# Patient Record
Sex: Male | Born: 1990 | Race: Black or African American | Hispanic: No | Marital: Single | Smoking: Never smoker
Health system: Southern US, Community
[De-identification: ages and names within clinical notes are randomized; demographics above are authoritative.]

---
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
## 2009-01-26 ENCOUNTER — Emergency Department (HOSPITAL_COMMUNITY): Admission: EM | Admit: 2009-01-26 | Discharge: 2009-01-26 | Payer: Self-pay | Admitting: Family Medicine

## 2010-11-28 ENCOUNTER — Emergency Department (HOSPITAL_COMMUNITY): Payer: Self-pay

## 2010-11-28 ENCOUNTER — Emergency Department (HOSPITAL_COMMUNITY)
Admission: EM | Admit: 2010-11-28 | Discharge: 2010-11-29 | Disposition: A | Discharge status: Home / Self Care | Payer: No Typology Code available for payment source | Attending: Emergency Medicine | Admitting: Emergency Medicine

## 2010-11-28 DIAGNOSIS — M25569 Pain in unspecified knee: Secondary | ICD-10-CM | POA: Insufficient documentation

## 2010-11-28 DIAGNOSIS — S01409A Unspecified open wound of unspecified cheek and temporomandibular area, initial encounter: Secondary | ICD-10-CM | POA: Insufficient documentation

## 2010-11-28 DIAGNOSIS — S99919A Unspecified injury of unspecified ankle, initial encounter: Secondary | ICD-10-CM | POA: Insufficient documentation

## 2010-11-28 DIAGNOSIS — R269 Unspecified abnormalities of gait and mobility: Secondary | ICD-10-CM | POA: Insufficient documentation

## 2010-11-28 DIAGNOSIS — R079 Chest pain, unspecified: Secondary | ICD-10-CM | POA: Insufficient documentation

## 2010-11-28 DIAGNOSIS — S8990XA Unspecified injury of unspecified lower leg, initial encounter: Secondary | ICD-10-CM | POA: Insufficient documentation

## 2010-11-28 DIAGNOSIS — M545 Low back pain, unspecified: Secondary | ICD-10-CM | POA: Insufficient documentation

## 2010-11-28 DIAGNOSIS — M546 Pain in thoracic spine: Secondary | ICD-10-CM | POA: Insufficient documentation

## 2010-11-28 DIAGNOSIS — R071 Chest pain on breathing: Secondary | ICD-10-CM | POA: Insufficient documentation

## 2010-11-28 DIAGNOSIS — R209 Unspecified disturbances of skin sensation: Secondary | ICD-10-CM | POA: Insufficient documentation

## 2010-11-28 DIAGNOSIS — S139XXA Sprain of joints and ligaments of unspecified parts of neck, initial encounter: Secondary | ICD-10-CM | POA: Insufficient documentation

## 2010-11-28 DIAGNOSIS — M542 Cervicalgia: Secondary | ICD-10-CM | POA: Insufficient documentation

## 2010-11-28 DIAGNOSIS — M79609 Pain in unspecified limb: Secondary | ICD-10-CM | POA: Insufficient documentation

## 2010-11-28 DIAGNOSIS — IMO0002 Reserved for concepts with insufficient information to code with codable children: Secondary | ICD-10-CM | POA: Insufficient documentation

## 2010-11-28 DIAGNOSIS — S6000XA Contusion of unspecified finger without damage to nail, initial encounter: Secondary | ICD-10-CM | POA: Insufficient documentation

## 2017-08-13 ENCOUNTER — Other Ambulatory Visit: Payer: Self-pay

## 2017-08-13 ENCOUNTER — Emergency Department (HOSPITAL_COMMUNITY)
Admission: EM | Admit: 2017-08-13 | Discharge: 2017-08-13 | Disposition: A | Discharge status: Home / Self Care | Payer: Self-pay | Attending: Emergency Medicine | Admitting: Emergency Medicine

## 2017-08-13 ENCOUNTER — Encounter (HOSPITAL_COMMUNITY): Payer: Self-pay | Admitting: Emergency Medicine

## 2017-08-13 ENCOUNTER — Emergency Department (HOSPITAL_COMMUNITY): Payer: Self-pay

## 2017-08-13 DIAGNOSIS — Y33XXXA Other specified events, undetermined intent, initial encounter: Secondary | ICD-10-CM | POA: Insufficient documentation

## 2017-08-13 DIAGNOSIS — S93402A Sprain of unspecified ligament of left ankle, initial encounter: Secondary | ICD-10-CM | POA: Insufficient documentation

## 2017-08-13 DIAGNOSIS — Y92310 Basketball court as the place of occurrence of the external cause: Secondary | ICD-10-CM | POA: Insufficient documentation

## 2017-08-13 DIAGNOSIS — Y998 Other external cause status: Secondary | ICD-10-CM | POA: Insufficient documentation

## 2017-08-13 DIAGNOSIS — Y9367 Activity, basketball: Secondary | ICD-10-CM | POA: Insufficient documentation

## 2017-08-13 MED ORDER — IBUPROFEN 600 MG PO TABS: 600.0000 mg | ORAL_TABLET | Freq: Four times a day (QID) | ORAL | 0 refills | Status: DC | PRN

## 2017-08-13 MED ORDER — CYCLOBENZAPRINE HCL 10 MG PO TABS: 10.0000 mg | ORAL_TABLET | Freq: Two times a day (BID) | ORAL | 0 refills | Status: DC | PRN

## 2017-08-13 NOTE — ED Triage Notes (Signed)
Pt rolled his ankle while playing basketball, denies other injuries.

## 2017-08-13 NOTE — ED Provider Notes (Signed)
[facility]   CSN: [id number] Arrival date & time: 08/13/17  1720     History   Chief Complaint Chief Complaint  Patient presents with  . Ankle Injury    HPI Adrian Mcpherson is a 26 y.o. male.  HPI   26 year old male presenting for evaluation of ankle injury.  Patient report this morning he was playing basketball.  When he jumped he landed awkwardly on his left ankle.  He report acute onset of sharp radiating pain from the ankle to foot, worsening with weightbearing or with movement.  States pain is 10 out of 10.  No associated knee or hip pain.  Aside from rest no other specific treatment tried.  Denies any numbness.  States he is unable to bear weight secondary to the pain.  History reviewed. No pertinent past medical history.  There are no active problems to display for this patient.        Home Medications    Prior to Admission medications   Not on File    Family History No family history on file.  Social History Social History   Tobacco Use  . Smoking status: Not on file  Substance Use Topics  . Alcohol use: Not on file  . Drug use: Not on file     Allergies   Patient has no known allergies.   Review of Systems Review of Systems  Constitutional: Negative for fever.  Musculoskeletal: Positive for arthralgias and joint swelling.  Skin: Negative for rash and wound.  Neurological: Negative for numbness.     Physical Exam Updated Vital Signs BP 138/80   Pulse 72   Temp 98.8 F (37.1 C) (Oral)   Resp 18   Ht 6\' 1"  (1.854 m)   Wt 95.3 kg (210 lb)   SpO2 98%   BMI 27.71 kg/m   Physical Exam  Constitutional: He appears well-developed and well-nourished. No distress.  HENT:  Head: Atraumatic.  Eyes: Conjunctivae are normal.  Neck: Neck supple.  Musculoskeletal: He exhibits tenderness (Left ankle: Tenderness to lateral malleoli region and dorsal region with associated swelling but  no crepitus or deformity.  Decreased ankle range of motion secondary to pain.  No significant pain to left foot on palpation.  Dorsalis pedis pulse palpable).  Neurological: He is alert.  Skin: No rash noted.  Psychiatric: He has a normal mood and affect.  Nursing note and vitals reviewed.    ED Treatments / Results  Labs (all labs ordered are listed, but only abnormal results are displayed) Labs Reviewed - No data to display  EKG  EKG Interpretation None       Radiology Dg Ankle Complete Left  Result Date: 08/13/2017 CLINICAL DATA:  Pain after trauma. EXAM: LEFT ANKLE COMPLETE - 3+ VIEW COMPARISON:  None. FINDINGS: There is no evidence of fracture, dislocation, or joint effusion. There is no evidence of arthropathy or other focal bone abnormality. Soft tissues are unremarkable. IMPRESSION: Negative. Electronically Signed   By: Gerome Samavid  Williams III M.D   On: 08/13/2017 18:02   Dg Foot Complete Left  Result Date: 08/13/2017 CLINICAL DATA:  Pain after trauma EXAM: LEFT FOOT - COMPLETE 3+ VIEW COMPARISON:  None. FINDINGS: There is no evidence of fracture or dislocation. There is no evidence of arthropathy or other focal bone abnormality. Soft tissues are unremarkable. IMPRESSION: Negative. Electronically Signed   By: Gerome Samavid  Williams III M.D   On: 08/13/2017 18:03    Procedures Procedures (including  critical care time)  Medications Ordered in ED Medications - No data to display   Initial Impression / Assessment and Plan / ED Course  I have reviewed the triage vital signs and the nursing notes.  Pertinent labs & imaging results that were available during my care of the patient were reviewed by me and considered in my medical decision making (see chart for details).     BP 138/80   Pulse 72   Temp 98.8 F (37.1 C) (Oral)   Resp 18   Ht 6\' 1"  (1.854 m)   Wt 95.3 kg (210 lb)   SpO2 98%   BMI 27.71 kg/m    Final Clinical Impressions(s) / ED Diagnoses   Final  diagnoses:  Sprain of left ankle, unspecified ligament, initial encounter    ED Discharge Orders        Ordered    ibuprofen (ADVIL,MOTRIN) 600 MG tablet  Every 6 hours PRN     08/13/17 1859    cyclobenzaprine (FLEXERIL) 10 MG tablet  2 times daily PRN     08/13/17 1859     7:00 PM Mechanical injury to left ankle.  Negative x-ray of left ankle and foot.  Rice therapy discussed.  Likely ankle sprain.  Crutches and ASO provided.   Fayrene Helperran, Danarius Mcconathy, PA-C 08/13/17 1901    Raeford RazorKohut, Stephen, MD 08/16/17 1146

## 2018-07-12 ENCOUNTER — Encounter (HOSPITAL_COMMUNITY): Payer: Self-pay | Admitting: Emergency Medicine

## 2018-07-12 ENCOUNTER — Ambulatory Visit (HOSPITAL_COMMUNITY)
Admission: EM | Admit: 2018-07-12 | Discharge: 2018-07-12 | Disposition: A | Discharge status: Home / Self Care | Payer: Self-pay | Attending: Family Medicine | Admitting: Family Medicine

## 2018-07-12 DIAGNOSIS — R300 Dysuria: Secondary | ICD-10-CM | POA: Insufficient documentation

## 2018-07-12 DIAGNOSIS — R369 Urethral discharge, unspecified: Secondary | ICD-10-CM | POA: Insufficient documentation

## 2018-07-12 MED ORDER — CEFTRIAXONE SODIUM 250 MG IJ SOLR
INTRAMUSCULAR | Status: AC
  Filled 2018-07-12: qty 250

## 2018-07-12 MED ORDER — CEFTRIAXONE SODIUM 250 MG IJ SOLR
250.0000 mg | Freq: Once | INTRAMUSCULAR | Status: AC
  Administered 2018-07-12: 250 mg via INTRAMUSCULAR

## 2018-07-12 MED ORDER — LIDOCAINE HCL (PF) 1 % IJ SOLN
INTRAMUSCULAR | Status: AC
  Filled 2018-07-12: qty 2

## 2018-07-12 MED ORDER — AZITHROMYCIN 250 MG PO TABS
ORAL_TABLET | ORAL | Status: AC
  Filled 2018-07-12: qty 4

## 2018-07-12 MED ORDER — AZITHROMYCIN 250 MG PO TABS
1000.0000 mg | ORAL_TABLET | Freq: Once | ORAL | Status: AC
  Administered 2018-07-12: 1000 mg via ORAL

## 2018-07-12 NOTE — Discharge Instructions (Signed)
Given rocephin 250mg  injection and azithromycin 1g in office Urine cytology obtained  Declines HIV/ syphilis testing today We will follow up with you regarding the results of your test If tests are positive, please abstain from sexual activity for at least 7 days and notify partners Follow up with PCP or [facility] if symptoms persists Return here or go to ER if you have any new or worsening symptoms

## 2018-07-12 NOTE — ED Triage Notes (Signed)
Pt c/o penile discharge x2 days, pt worried about bladder infection. Pt is sexually active.

## 2018-07-12 NOTE — ED Provider Notes (Signed)
[facility]   [id number] 07/12/18 Arrival Time: 1038   CC: Penile discharge and dysuria  SUBJECTIVE:  Adrian Mcpherson is a 27 y.o. male who presents with penile discharge and dysuria x 2 days.   Partner asymptomatic.  Last unprotected sexual encounter 2 weeks ago.  Sexually active with 2 male partners within the last 6 months.  Denies similar symptoms in the past.  Denies fever, chills, nausea, vomiting, abdominal or pelvic pain, penile rashes or lesions, testicular swelling or pain.     No LMP for male patient.  ROS: As per HPI.  History reviewed. No pertinent past medical history. History reviewed. No pertinent surgical history. No Known Allergies No current facility-administered medications on file prior to encounter.    Current Outpatient Medications on File Prior to Encounter  Medication Sig Dispense Refill  . cyclobenzaprine (FLEXERIL) 10 MG tablet Take 1 tablet (10 mg total) by mouth 2 (two) times daily as needed for muscle spasms. (Patient not taking: Reported on 07/12/2018) 20 tablet 0  . ibuprofen (ADVIL,MOTRIN) 600 MG tablet Take 1 tablet (600 mg total) by mouth every 6 (six) hours as needed. (Patient not taking: Reported on 07/12/2018) 30 tablet 0   Social History   Socioeconomic History  . Marital status: Single    Spouse name: Not on file  . Number of children: Not on file  . Years of education: Not on file  . Highest education level: Not on file  Occupational History  . Not on file  Social Needs  . Financial resource strain: Not on file  . Food insecurity:    Worry: Not on file    Inability: Not on file  . Transportation needs:    Medical: Not on file    Non-medical: Not on file  Tobacco Use  . Smoking status: Never Smoker  Substance and Sexual Activity  . Alcohol use: Never    Frequency: Never  . Drug use: Never  . Sexual activity: Not on file  Lifestyle  . Physical activity:    Days per week: Not on file    Minutes per session:  Not on file  . Stress: Not on file  Relationships  . Social connections:    Talks on phone: Not on file    Gets together: Not on file    Attends religious service: Not on file    Active member of club or organization: Not on file    Attends meetings of clubs or organizations: Not on file    Relationship status: Not on file  . Intimate partner violence:    Fear of current or ex partner: Not on file    Emotionally abused: Not on file    Physically abused: Not on file    Forced sexual activity: Not on file  Other Topics Concern  . Not on file  Social History Narrative  . Not on file   Family History  Problem Relation Age of Onset  . Healthy Mother   . Healthy Father     OBJECTIVE:  Vitals:   07/12/18 1053  BP: (!) 153/69  Pulse: (!) 52  Resp: 14  Temp: 98 F (36.7 C)  SpO2: 100%     General appearance: alert, NAD, appears stated age Head: NCAT Throat: lips, mucosa, and tongue normal; teeth and gums normal Lungs: CTA bilaterally without adventitious breath sounds Heart: regular rate and rhythm.  Radial pulses 2+ symmetrical bilaterally Back: no CVA tenderness Abdomen: soft, non-tender; bowel sounds normal; no guarding  GU: declines  Skin: warm and dry Psychological:  Alert and cooperative. Normal mood and affect.  ASSESSMENT & PLAN:  1. Penile discharge   2. Dysuria     Meds ordered this encounter  Medications  . azithromycin (ZITHROMAX) tablet 1,000 mg  . cefTRIAXone (ROCEPHIN) injection 250 mg    Pending: Labs Reviewed  URINE CYTOLOGY ANCILLARY ONLY    Given rocephin 250mg  injection and azithromycin 1g in office Urine cytology obtained  Declines HIV/ syphilis testing today We will follow up with you regarding the results of your test If tests are positive, please abstain from sexual activity for at least 7 days and notify partners Follow up with PCP or [facility] if symptoms persists Return here or go to ER if you have any new or worsening  symptoms    Reviewed expectations re: course of current medical issues. Questions answered. Outlined signs and symptoms indicating need for more acute intervention. Patient verbalized understanding. After Visit Summary given.       Rennis HardingWurst, Cordie Beazley, PA-C 07/12/18 1114

## 2018-07-13 LAB — URINE CYTOLOGY ANCILLARY ONLY
CHLAMYDIA, DNA PROBE: NEGATIVE
Neisseria Gonorrhea: POSITIVE — AB
Trichomonas: NEGATIVE

## 2018-07-14 ENCOUNTER — Telehealth (HOSPITAL_COMMUNITY): Payer: Self-pay | Admitting: Emergency Medicine

## 2018-07-14 NOTE — Telephone Encounter (Signed)
Test for gonorrhea was positive. This was treated at the urgent care visit with IM rocephin 250mg  and po zithromax 1g. Pt educated to refrain from sexual intercourse for 7 days after treatment to give the medicine time to work. Sexual partners need to be notified and tested/treated. Condoms may reduce risk of reinfection. Recheck or followup with PCP for further evaluation if symptoms are not improving. [facility] notified.   Spoke with patient about his test results. All questions answered.

## 2021-07-13 ENCOUNTER — Emergency Department (HOSPITAL_COMMUNITY)
Admission: EM | Admit: 2021-07-13 | Discharge: 2021-07-13 | Discharge status: Against Medical Advice | Payer: PRIVATE HEALTH INSURANCE | Attending: Emergency Medicine | Admitting: Emergency Medicine

## 2021-07-13 ENCOUNTER — Emergency Department (HOSPITAL_COMMUNITY): Payer: PRIVATE HEALTH INSURANCE

## 2021-07-13 DIAGNOSIS — R319 Hematuria, unspecified: Secondary | ICD-10-CM | POA: Insufficient documentation

## 2021-07-13 DIAGNOSIS — Z5321 Procedure and treatment not carried out due to patient leaving prior to being seen by health care provider: Secondary | ICD-10-CM | POA: Insufficient documentation

## 2021-07-13 DIAGNOSIS — R109 Unspecified abdominal pain: Secondary | ICD-10-CM | POA: Diagnosis not present

## 2021-07-13 LAB — URINALYSIS, MICROSCOPIC (REFLEX)
Bacteria, UA: NONE SEEN
RBC / HPF: >50 RBC/hpf (ref 0–5)

## 2021-07-13 LAB — URINALYSIS, ROUTINE W REFLEX MICROSCOPIC
Bilirubin Urine: NEGATIVE
Glucose, UA: NEGATIVE mg/dL
Ketones, ur: NEGATIVE mg/dL
Nitrite: NEGATIVE
Protein, ur: NEGATIVE mg/dL
Specific Gravity, Urine: 1.025 (ref 1.005–1.030)
pH: 6.5 (ref 5.0–8.0)

## 2021-07-13 LAB — COMPREHENSIVE METABOLIC PANEL
ALT: 18 U/L (ref 0–44)
AST: 22 U/L (ref 15–41)
Albumin: 4.2 g/dL (ref 3.5–5.0)
Alkaline Phosphatase: 65 U/L (ref 38–126)
Anion gap: 6 (ref 5–15)
BUN: 10 mg/dL (ref 6–20)
CO2: 26 mmol/L (ref 22–32)
Calcium: 9.3 mg/dL (ref 8.9–10.3)
Chloride: 105 mmol/L (ref 98–111)
Creatinine, Ser: 1.34 mg/dL — ABNORMAL HIGH (ref 0.61–1.24)
GFR, Estimated: >60 mL/min (ref >60)
Glucose, Bld: 92 mg/dL (ref 70–99)
Potassium: 4.3 mmol/L (ref 3.5–5.1)
Sodium: 137 mmol/L (ref 135–145)
Total Bilirubin: 0.7 mg/dL (ref 0.3–1.2)
Total Protein: 6.9 g/dL (ref 6.5–8.1)

## 2021-07-13 LAB — LIPASE, BLOOD: Lipase: 30 U/L (ref 11–51)

## 2021-07-13 LAB — CBC
HCT: 45.2 % (ref 39.0–52.0)
Hemoglobin: 14.2 g/dL (ref 13.0–17.0)
MCH: 27.7 pg (ref 26.0–34.0)
MCHC: 31.4 g/dL (ref 30.0–36.0)
MCV: 88.3 fL (ref 80.0–100.0)
Platelets: 183 10*3/uL (ref 150–400)
RBC: 5.12 MIL/uL (ref 4.22–5.81)
RDW: 12.7 % (ref 11.5–15.5)
WBC: 5.3 10*3/uL (ref 4.0–10.5)
nRBC: 0 % (ref 0.0–0.2)

## 2021-07-13 NOTE — ED Notes (Signed)
Patient decided to leave stated  that he had to pick his son up

## 2021-07-13 NOTE — ED Triage Notes (Signed)
Pt here d/t hematuria and abdominal pain. 6/10. Pt states he was elbowed in abdomin while playing basketball. Denies N/V/fever.

## 2021-07-13 NOTE — ED Provider Notes (Signed)
Emergency Medicine Provider Triage Evaluation Note  Adrian Mcpherson , a 30 y.o. male  was evaluated in triage.  Pt complains of abdominal pain x 1 week and hematuria starting today. Patient states he was elbowed in his right abdomen about a week and a half ago and had consistent pain. He states when using the restroom today he noticed blood dribbling from his urethra after urinating. No history of kidney stones.   Review of Systems  Positive: Hematuria, increased urinary frequency, abdominal pain Negative: Flank pain, scrotal pain, fever, diarrhea, constipation, blood in stool  Physical Exam  BP 127/85   Pulse (!) 50   Temp 98.4 F (36.9 C) (Oral)   Resp 16   SpO2 100%  Gen:   Awake, no distress   Resp:  Normal effort  MSK:   Moves extremities without difficulty  Other:  Abdomen soft, nontender, nondistended  Medical Decision Making  Medically screening exam initiated at 3:25 PM.  Appropriate orders placed.  Adrian Mcpherson was informed that the remainder of the evaluation will be completed by another provider, this initial triage assessment does not replace that evaluation, and the importance of remaining in the ED until their evaluation is complete.     Adrian Mcpherson 07/13/21 1527    Milagros Loll, MD 07/14/21 [phone]

## 2021-07-15 ENCOUNTER — Encounter (HOSPITAL_COMMUNITY): Payer: Self-pay | Admitting: Emergency Medicine

## 2021-07-15 ENCOUNTER — Ambulatory Visit (HOSPITAL_COMMUNITY)
Admission: EM | Admit: 2021-07-15 | Discharge: 2021-07-15 | Disposition: A | Discharge status: Home / Self Care | Payer: PRIVATE HEALTH INSURANCE | Attending: Internal Medicine | Admitting: Internal Medicine

## 2021-07-15 ENCOUNTER — Other Ambulatory Visit: Payer: Self-pay

## 2021-07-15 DIAGNOSIS — R3129 Other microscopic hematuria: Secondary | ICD-10-CM | POA: Insufficient documentation

## 2021-07-15 LAB — POCT URINALYSIS DIPSTICK, ED / UC
Bilirubin Urine: NEGATIVE
Glucose, UA: NEGATIVE mg/dL
Ketones, ur: NEGATIVE mg/dL
Nitrite: NEGATIVE
Protein, ur: 100 mg/dL — AB
Specific Gravity, Urine: >=1.03 (ref 1.005–1.030)
Urobilinogen, UA: 1 mg/dL (ref 0.0–1.0)
pH: 6 (ref 5.0–8.0)

## 2021-07-15 MED ORDER — NITROFURANTOIN MONOHYD MACRO 100 MG PO CAPS: 100.0000 mg | ORAL_CAPSULE | Freq: Two times a day (BID) | ORAL | 0 refills | Status: AC

## 2021-07-15 NOTE — ED Triage Notes (Signed)
Pt presents with frequent urination, bladder pressure, blood in urine, and bump on penis xs 1 week.

## 2021-07-15 NOTE — ED Provider Notes (Signed)
[place]    CSN: [id number] Arrival date & time: 07/15/21  [id number]      History   Chief Complaint Chief Complaint  Patient presents with   Urinary Frequency   Hematuria    HPI Adrian Mcpherson is a 30 y.o. male.   Hematuria, increased frequency Presented to ED on 11/14, but left without being seen in Triage had UA with microscopic hematuria and leukocytes, negative renal CT Reports that he has been having pink urine for about a week Denies any frank blood from the urethra States that he was hit about a week and a half ago in the flank while playing basketball, but does not think that this is affecting his pain He does not have any significant abdominal pain, only reports some discomfort over the bladder He denies any penile discharge, does not think that he has any sexually transmitted disease, but is not sure Reports increased frequency, urgency Denies flank pain, fever, vomiting Reports that he is able to urinate well and denies any difficulty eating States that this morning he noticed a swollen lymph node in his groin    History reviewed. No pertinent past medical history.  There are no problems to display for this patient.   History reviewed. No pertinent surgical history.     Home Medications    Prior to Admission medications   Medication Sig Start Date End Date Taking? Authorizing Provider  nitrofurantoin, macrocrystal-monohydrate, (MACROBID) 100 MG capsule Take 1 capsule (100 mg total) by mouth 2 (two) times daily for 7 days. 07/15/21 07/22/21 Yes Ladarious Kresse, Bernita Raisin, DO    Family History Family History  Problem Relation Age of Onset   Healthy Mother    Healthy Father     Social History Social History   Tobacco Use   Smoking status: Never   Smokeless tobacco: Never  Substance Use Topics   Alcohol use: Never   Drug use: Never     Allergies   Patient has no known allergies.   Review of Systems Review of Systems  All  other systems reviewed and are negative. Per HPI  Physical Exam Triage Vital Signs ED Triage Vitals  Enc Vitals Group     BP 07/15/21 0845 (!) 142/84     Pulse Rate 07/15/21 0845 (!) 56     Resp 07/15/21 0845 16     Temp 07/15/21 0845 98.2 F (36.8 C)     Temp Source 07/15/21 0845 Oral     SpO2 07/15/21 0845 97 %     Weight --      Height --      Head Circumference --      Peak Flow --      Pain Score 07/15/21 0844 3     Pain Loc --      Pain Edu? --      Excl. in [place]? --    No data found.  Updated Vital Signs BP (!) 142/84 (BP Location: Left Arm)   Pulse (!) 56   Temp 98.2 F (36.8 C) (Oral)   Resp 16   SpO2 97%   Visual Acuity Right Eye Distance:   Left Eye Distance:   Bilateral Distance:    Right Eye Near:   Left Eye Near:    Bilateral Near:     Physical Exam Constitutional:      General: He is not in acute distress.    Appearance: Normal appearance. He is not ill-appearing.  HENT:  Head: Normocephalic and atraumatic.  Cardiovascular:     Rate and Rhythm: Normal rate.  Pulmonary:     Effort: Pulmonary effort is normal.  Abdominal:     Palpations: Abdomen is soft.     Tenderness: There is no abdominal tenderness. There is no right CVA tenderness, left CVA tenderness, guarding or rebound.  Skin:    General: Skin is warm and dry.  Neurological:     General: No focal deficit present.     Mental Status: He is alert and oriented to person, place, and time.     UC Treatments / Results  Labs (all labs ordered are listed, but only abnormal results are displayed) Labs Reviewed  POCT URINALYSIS DIPSTICK, ED / UC - Abnormal; Notable for the following components:      Result Value   Hgb urine dipstick LARGE (*)    Protein, ur 100 (*)    Leukocytes,Ua MODERATE (*)    All other components within normal limits  URINE CULTURE  CYTOLOGY, (ORAL, ANAL, URETHRAL) ANCILLARY ONLY    EKG   Radiology CT Renal Stone Study  Result Date:  07/13/2021 CLINICAL DATA:  Hematuria and abdominal pain. EXAM: CT ABDOMEN AND PELVIS WITHOUT CONTRAST TECHNIQUE: Multidetector CT imaging of the abdomen and pelvis was performed following the standard protocol without IV contrast. COMPARISON:  None. FINDINGS: Lower chest: No acute abnormality. Hepatobiliary: No focal liver abnormality is seen. No gallstones, gallbladder wall thickening, or biliary dilatation. Pancreas: Unremarkable. No pancreatic ductal dilatation or surrounding inflammatory changes. Spleen: Normal in size without focal abnormality. Adrenals/Urinary Tract: Adrenal glands are unremarkable. Kidneys are normal, without renal calculi, focal lesion, or hydronephrosis. Bladder is unremarkable. Stomach/Bowel: Stomach is within normal limits. Appendix appears normal. No evidence of bowel wall thickening, distention, or inflammatory changes. Vascular/Lymphatic: No significant vascular findings are present. No enlarged abdominal or pelvic lymph nodes. Reproductive: Prostate is unremarkable. Other: There is a small fat containing umbilical hernia. There is no free fluid or free air. Musculoskeletal: No acute or significant osseous findings. IMPRESSION: No acute localizing process in the abdomen or pelvis. Electronically Signed   By: Darliss Cheney M.D.   On: 07/13/2021 17:27    Procedures Procedures (including critical care time)  Medications Ordered in UC Medications - No data to display  Initial Impression / Assessment and Plan / UC Course  I have reviewed the triage vital signs and the nursing notes.  Pertinent labs & imaging results that were available during my care of the patient were reviewed by me and considered in my medical decision making (see chart for details).     He has had a normal renal CT.  He has microscopic hematuria, no frank blood at the urethra.  We will send urine for culture and also obtain STD testing.  We will go ahead and treat him for urinary tract infection, however  recommended that he follow-up with urology even if his symptoms improve given his microscopic hematuria will require further evaluation.  Also provided PCP assistance.  Given ED precautions, see AVS. Final Clinical Impressions(s) / UC Diagnoses   Final diagnoses:  Other microscopic hematuria     Discharge Instructions      We have sent a prescription for an antibiotic.  I have sent your urine off for culture to ensure if you have an infection or not.  We will also collect STD testing to make sure that this is not contributing to your symptoms.  I still recommend that you follow-up with a  urologist.  The primary care provider that I have added to this list is taking new patients, please give their office a call to schedule an appointment.  If you have significant worsening of your symptoms, especially if you have fever, worsening pain, frank blood coming from your penis, you should be seen in the emergency room right away.     ED Prescriptions     Medication Sig Dispense Auth. Provider   nitrofurantoin, macrocrystal-monohydrate, (MACROBID) 100 MG capsule Take 1 capsule (100 mg total) by mouth 2 (two) times daily for 7 days. 14 capsule Halvor Behrend, Bernita Raisin, DO      PDMP not reviewed this encounter.   [place], Bernita Raisin, DO 07/15/21 [phone]

## 2021-07-15 NOTE — Discharge Instructions (Addendum)
We have sent a prescription for an antibiotic.  I have sent your urine off for culture to ensure if you have an infection or not.  We will also collect STD testing to make sure that this is not contributing to your symptoms.  I still recommend that you follow-up with a urologist.  The primary care provider that I have added to this list is taking new patients, please give their office a call to schedule an appointment.  If you have significant worsening of your symptoms, especially if you have fever, worsening pain, frank blood coming from your penis, you should be seen in the emergency room right away.

## 2021-07-16 ENCOUNTER — Telehealth (HOSPITAL_COMMUNITY): Payer: Self-pay | Admitting: Emergency Medicine

## 2021-07-16 LAB — CYTOLOGY, (ORAL, ANAL, URETHRAL) ANCILLARY ONLY
Chlamydia: NEGATIVE
Comment: NEGATIVE
Comment: NEGATIVE
Comment: NORMAL
Neisseria Gonorrhea: POSITIVE — AB
Trichomonas: NEGATIVE

## 2021-07-16 LAB — URINE CULTURE: Culture: <10000 — AB

## 2021-07-16 NOTE — Telephone Encounter (Signed)
Per protocol, patient will need treatment with IM Rocephin 500mg  for positive Gonorrhea.  Can d/c Macrobid, negative urine culture.   Attempted to reach patient x 1, it rings twice and then busy signal x 3 [facility] notified Results seen on mychart

## 2022-02-23 ENCOUNTER — Other Ambulatory Visit: Payer: Self-pay

## 2022-02-23 ENCOUNTER — Emergency Department (HOSPITAL_COMMUNITY)
Admission: EM | Admit: 2022-02-23 | Discharge: 2022-02-23 | Disposition: A | Discharge status: Home / Self Care | Payer: Self-pay | Attending: Emergency Medicine | Admitting: Emergency Medicine

## 2022-02-23 ENCOUNTER — Emergency Department (HOSPITAL_COMMUNITY): Payer: Self-pay

## 2022-02-23 ENCOUNTER — Encounter (HOSPITAL_COMMUNITY): Payer: Self-pay

## 2022-02-23 DIAGNOSIS — S8261XA Displaced fracture of lateral malleolus of right fibula, initial encounter for closed fracture: Secondary | ICD-10-CM

## 2022-02-23 DIAGNOSIS — Y9367 Activity, basketball: Secondary | ICD-10-CM | POA: Insufficient documentation

## 2022-02-23 DIAGNOSIS — X501XXA Overexertion from prolonged static or awkward postures, initial encounter: Secondary | ICD-10-CM | POA: Insufficient documentation

## 2022-02-23 DIAGNOSIS — S93401A Sprain of unspecified ligament of right ankle, initial encounter: Secondary | ICD-10-CM | POA: Insufficient documentation

## 2022-02-23 DIAGNOSIS — M7989 Other specified soft tissue disorders: Secondary | ICD-10-CM | POA: Insufficient documentation

## 2022-02-23 MED ORDER — OXYCODONE-ACETAMINOPHEN 5-325 MG PO TABS
1.0000 | ORAL_TABLET | Freq: Once | ORAL | Status: AC
  Administered 2022-02-23: 1 via ORAL
  Filled 2022-02-23: qty 1

## 2022-02-23 MED ORDER — IBUPROFEN 200 MG PO TABS
600.0000 mg | ORAL_TABLET | Freq: Once | ORAL | Status: AC
  Administered 2022-02-23: 600 mg via ORAL
  Filled 2022-02-23: qty 3

## 2022-02-23 NOTE — ED Provider Notes (Signed)
[facility]-EMERGENCY DEPT Provider Note   CSN: [id number] Arrival date & time: 02/23/22  1859     History  Chief Complaint  Patient presents with   Ankle Injury    Adrian Mcpherson is a 31 y.o. male.   Ankle Injury  Here with right ankle pain and some mild foot pain after rolling his ankle while playing basketball today.  He states this occurred approximately an hour before arrival emergency room  No head injury or loss of consciousness.  No other associate symptoms.  Has taken medications prior to arrival.  No other injuries.     Home Medications Prior to Admission medications   Not on File      Allergies    Patient has no known allergies.    Review of Systems   Review of Systems  Physical Exam Updated Vital Signs BP (!) 150/107 (BP Location: Right Arm)   Pulse 80   Temp 98.1 F (36.7 C) (Oral)   Resp 18   Ht 6\' 1"  (1.854 m)   Wt 99.8 kg   SpO2 99%   BMI 29.03 kg/m  Physical Exam Vitals and nursing note reviewed.  Constitutional:      General: He is not in acute distress.    Appearance: Normal appearance. He is not ill-appearing.  HENT:     Head: Normocephalic and atraumatic.  Eyes:     General: No scleral icterus.       Right eye: No discharge.        Left eye: No discharge.     Conjunctiva/sclera: Conjunctivae normal.  Pulmonary:     Effort: Pulmonary effort is normal.     Breath sounds: No stridor.  Musculoskeletal:     Comments: Mild tenderness to palpation at base of fifth metatarsal Tenderness palpation of right lateral malleolus no posterior or medial ankle tenderness.  There is some swelling.  Sensation intact in all toes cap refill less than 2 seconds able to wiggle toes DP pulse 2+  Neurological:     Mental Status: He is alert and oriented to person, place, and time. Mental status is at baseline.     ED Results / Procedures / Treatments   Labs (all labs ordered are listed, but only abnormal results are  displayed) Labs Reviewed - No data to display  EKG None  Radiology No results found.  Procedures Procedures    Medications Ordered in ED Medications  ibuprofen (ADVIL) tablet 600 mg (600 mg Oral Given 02/23/22 2004)  oxyCODONE-acetaminophen (PERCOCET/ROXICET) 5-325 MG per tablet 1 tablet (1 tablet Oral Given 02/23/22 2004)    ED Course/ Medical Decision Making/ A&P                           Medical Decision Making Amount and/or Complexity of Data Reviewed Radiology: ordered.  Risk OTC drugs. Prescription drug management.   Here with right ankle pain and some mild foot pain after rolling his ankle while playing basketball today.  He states this occurred approximately an hour before arrival emergency room  No head injury or loss of consciousness.  No other associate symptoms.  Has taken medications prior to arrival.  No other injuries.  Lateral malleolus tenderness of the right ankle and mild tenderness at the base of the right fifth metatarsal.  Suspect ankle sprain versus lateral malleolus fracture.  X-ray obtained I viewed the images of this x-ray personally and agree with radiology read.  There  is a small avulsion fracture as result of the significant ankle sprain he suffered.  We will place in a cam walker provide crutches and recommend follow-up with orthopedics.  Return precautions discussed.  Patient agreeable to plan.  All questions answered to the best of my ability  Final Clinical Impression(s) / ED Diagnoses Final diagnoses:  Sprain of right ankle, unspecified ligament, initial encounter    Rx / DC Orders ED Discharge Orders     None         Gailen Shelter, [place] 02/23/22 2040    Arby Barrette, MD 02/26/22 1229

## 2022-02-23 NOTE — ED Triage Notes (Signed)
Pt reports with a right ankle injury after playing basketball today and falling around 1830.

## 2022-06-24 ENCOUNTER — Ambulatory Visit
Admission: RE | Admit: 2022-06-24 | Discharge: 2022-06-24 | Disposition: A | Discharge status: Home / Self Care | Payer: PRIVATE HEALTH INSURANCE | Source: Ambulatory Visit | Attending: Emergency Medicine | Admitting: Emergency Medicine

## 2022-06-24 VITALS — BP 136/83 | HR 80 | Temp 97.8°F | Resp 16

## 2022-06-24 DIAGNOSIS — Z9189 Other specified personal risk factors, not elsewhere classified: Secondary | ICD-10-CM | POA: Diagnosis present

## 2022-06-24 DIAGNOSIS — Z113 Encounter for screening for infections with a predominantly sexual mode of transmission: Secondary | ICD-10-CM | POA: Insufficient documentation

## 2022-06-24 DIAGNOSIS — R300 Dysuria: Secondary | ICD-10-CM | POA: Insufficient documentation

## 2022-06-24 LAB — POCT URINALYSIS DIP (MANUAL ENTRY)
Blood, UA: NEGATIVE
Glucose, UA: NEGATIVE mg/dL
Ketones, POC UA: NEGATIVE mg/dL
Nitrite, UA: NEGATIVE
Protein Ur, POC: 30 mg/dL — AB
Spec Grav, UA: >=1.03 — AB (ref 1.010–1.025)
Urobilinogen, UA: 1 E.U./dL
pH, UA: 6 (ref 5.0–8.0)

## 2022-06-24 NOTE — ED Triage Notes (Signed)
Patient c/o urinary frequency and lower abd pain x 1 week.   Home interventions: none

## 2022-06-24 NOTE — Discharge Instructions (Signed)
The results of your STD testing today which screens for gonorrhea, chlamydia, and trichomonas will be made posted to your MyChart account once it is complete.  This typically takes 2 to 4 days.  Please abstain from sexual intercourse of any kind, vaginal, oral or anal, until you have received the results of your STD testing.     If any of your results are abnormal, you will receive a phone call regarding treatment.  Prescriptions, if any are needed, will be provided for you at your pharmacy.     Our point-of-care analysis of your urine sample today was normal and did not reveal any concern for urinary tract infection.   If you have not had complete resolution of your symptoms after completing any needed treatment provided based on your STD test results, please return for repeat evaluation.   Thank you for visiting urgent care today.  I appreciate the opportunity to participate in your care.

## 2022-06-24 NOTE — ED Provider Notes (Signed)
[facility]    CSN: [id number] Arrival date & time: 06/24/22  1517    HISTORY   Chief Complaint  Patient presents with   Urinary Frequency    Believe I have a UTI - Entered by patient   HPI Adrian Mcpherson is a pleasant, 31 y.o. male who presents to urgent care today. Patient c/o urinary frequency, burning with urination and suprapubic pain x 1 week, denies penile discharge, testicular/scrotal pain/swelling, fever, constipation.  Patient states he has engaged in sexual intercourse without using a condom.  Patient states he is unaware of his partner having any symptoms of STD.  Patient request STD testing today.    The history is provided by the patient.   History reviewed. No pertinent past medical history. There are no problems to display for this patient.  History reviewed. No pertinent surgical history.  Home Medications    Prior to Admission medications   Not on File    Family History Family History  Problem Relation Age of Onset   Healthy Mother    Healthy Father    Social History Social History   Tobacco Use   Smoking status: Never   Smokeless tobacco: Never  Vaping Use   Vaping Use: Never used  Substance Use Topics   Alcohol use: Never   Drug use: Never   Allergies   Patient has no known allergies.  Review of Systems Review of Systems Pertinent findings revealed after performing a 14 point review of systems has been noted in the history of present illness.  Physical Exam Triage Vital Signs ED Triage Vitals  Enc Vitals Group     BP 06/26/21 0827 (!) 147/82     Pulse Rate 06/26/21 0827 72     Resp 06/26/21 0827 18     Temp 06/26/21 0827 98.3 F (36.8 C)     Temp Source 06/26/21 0827 Oral     SpO2 06/26/21 0827 98 %     Weight --      Height --      Head Circumference --      Peak Flow --      Pain Score 06/26/21 0826 5     Pain Loc --      Pain Edu? --      Excl. in [place]? --   No data found.  Updated Vital Signs BP  136/83 (BP Location: Left Arm)   Pulse 80   Temp 97.8 F (36.6 C) (Oral)   Resp 16   SpO2 96%   Physical Exam Vitals and nursing note reviewed.  Constitutional:      General: He is not in acute distress.    Appearance: Normal appearance. He is not ill-appearing.  HENT:     Head: Normocephalic and atraumatic.  Eyes:     General: Lids are normal.        Right eye: No discharge.        Left eye: No discharge.     Extraocular Movements: Extraocular movements intact.     Conjunctiva/sclera: Conjunctivae normal.     Right eye: Right conjunctiva is not injected.     Left eye: Left conjunctiva is not injected.  Neck:     Trachea: Trachea and phonation normal.  Cardiovascular:     Rate and Rhythm: Normal rate and regular rhythm.     Pulses: Normal pulses.     Heart sounds: Normal heart sounds. No murmur heard.    No friction rub. No gallop.  Pulmonary:     Effort: Pulmonary effort is normal. No accessory muscle usage, prolonged expiration or respiratory distress.     Breath sounds: Normal breath sounds. No stridor, decreased air movement or transmitted upper airway sounds. No decreased breath sounds, wheezing, rhonchi or rales.  Chest:     Chest wall: No tenderness.  Genitourinary:    Comments: Pt politely declines GU exam, pt did provide a penile swab for testing.   Musculoskeletal:        General: Normal range of motion.     Cervical back: Normal range of motion and neck supple. Normal range of motion.  Lymphadenopathy:     Cervical: No cervical adenopathy.  Skin:    General: Skin is warm and dry.     Findings: No erythema or rash.  Neurological:     General: No focal deficit present.     Mental Status: He is alert and oriented to person, place, and time.  Psychiatric:        Mood and Affect: Mood normal.        Behavior: Behavior normal.     Visual Acuity Right Eye Distance:   Left Eye Distance:   Bilateral Distance:    Right Eye Near:   Left Eye Near:     Bilateral Near:     UC Couse / Diagnostics / Procedures:     Radiology No results found.  Procedures Procedures (including critical care time) EKG  Pending results:  Labs Reviewed  POCT URINALYSIS DIP (MANUAL ENTRY) - Abnormal; Notable for the following components:      Result Value   Clarity, UA cloudy (*)    Bilirubin, UA small (*)    Spec Grav, UA >=1.030 (*)    Protein Ur, POC =30 (*)    Leukocytes, UA Small (1+) (*)    All other components within normal limits  CYTOLOGY, (ORAL, ANAL, URETHRAL) ANCILLARY ONLY    Medications Ordered in UC: Medications - No data to display  UC Diagnoses / Final Clinical Impressions(s)   I have reviewed the triage vital signs and the nursing notes.  Pertinent labs & imaging results that were available during my care of the patient were reviewed by me and considered in my medical decision making (see chart for details).    Final diagnoses:  Dysuria  At risk for sexually transmitted disease due to unprotected sex  Screening examination for STD (sexually transmitted disease)   STD screening was performed, patient advised that the results be posted to their MyChart and if any of the results are positive, they will be notified by phone, further treatment will be provided as indicated based on results of STD screening. Patient was advised to abstain from sexual intercourse until that they receive the results of their STD testing.  Patient was also advised to use condoms to protect themselves from STD exposure. Urinalysis today was not concerning for bladder or kidney infection. Return precautions advised.  Drug allergies reviewed, all questions addressed.   ED Prescriptions   None    PDMP not reviewed this encounter.  Disposition Upon Discharge:  Condition: stable for discharge home  Patient presented with concern for an acute illness with associated systemic symptoms and significant discomfort requiring urgent management. In my  opinion, this is a condition that a prudent lay person (someone who possesses an average knowledge of health and medicine) may potentially expect to result in complications if not addressed urgently such as respiratory distress, impairment of bodily function or dysfunction  of bodily organs.   As such, the patient has been evaluated and assessed, work-up was performed and treatment was provided in alignment with urgent care protocols and evidence based medicine.  Patient/parent/caregiver has been advised that the patient may require follow up for further testing and/or treatment if the symptoms continue in spite of treatment, as clinically indicated and appropriate.  Routine symptom specific, illness specific and/or disease specific instructions were discussed with the patient and/or caregiver at length.  Prevention strategies for avoiding STD exposure were also discussed.  The patient will follow up with their current PCP if and as advised. If the patient does not currently have a PCP we will assist them in obtaining one.   The patient may need specialty follow up if the symptoms continue, in spite of conservative treatment and management, for further workup, evaluation, consultation and treatment as clinically indicated and appropriate.  Patient/parent/caregiver verbalized understanding and agreement of plan as discussed.  All questions were addressed during visit.  Please see discharge instructions below for further details of plan.  Discharge Instructions:   Discharge Instructions      The results of your STD testing today which screens for gonorrhea, chlamydia, and trichomonas will be made posted to your MyChart account once it is complete.  This typically takes 2 to 4 days.  Please abstain from sexual intercourse of any kind, vaginal, oral or anal, until you have received the results of your STD testing.     If any of your results are abnormal, you will receive a phone call regarding  treatment.  Prescriptions, if any are needed, will be provided for you at your pharmacy.     Our point-of-care analysis of your urine sample today was normal and did not reveal any concern for urinary tract infection.   If you have not had complete resolution of your symptoms after completing any needed treatment provided based on your STD test results, please return for repeat evaluation.   Thank you for visiting urgent care today.  I appreciate the opportunity to participate in your care.          This office note has been dictated using Museum/gallery curator.  Unfortunately, this method of dictation can sometimes lead to typographical or grammatical errors.  I apologize for your inconvenience in advance if this occurs.  Please do not hesitate to reach out to me if clarification is needed.       Lynden Oxford Scales, [place] 06/24/22 [phone]

## 2022-07-02 ENCOUNTER — Telehealth (HOSPITAL_COMMUNITY): Payer: Self-pay | Admitting: Emergency Medicine

## 2022-07-02 LAB — CYTOLOGY, (ORAL, ANAL, URETHRAL) ANCILLARY ONLY
Chlamydia: NEGATIVE
Comment: NEGATIVE
Comment: NEGATIVE
Comment: NORMAL
Neisseria Gonorrhea: POSITIVE — AB
Trichomonas: NEGATIVE

## 2022-07-02 NOTE — Telephone Encounter (Signed)
Per protocol, patient will need treatment with IM Rocephin 500mg for positive Gonorrhea. Results seen on mychart 

## 2022-10-15 ENCOUNTER — Other Ambulatory Visit: Payer: Self-pay

## 2022-10-15 ENCOUNTER — Encounter (HOSPITAL_BASED_OUTPATIENT_CLINIC_OR_DEPARTMENT_OTHER): Payer: Self-pay | Admitting: Emergency Medicine

## 2022-10-15 ENCOUNTER — Emergency Department (HOSPITAL_BASED_OUTPATIENT_CLINIC_OR_DEPARTMENT_OTHER)
Admission: EM | Admit: 2022-10-15 | Discharge: 2022-10-15 | Disposition: A | Discharge status: Home / Self Care | Payer: PRIVATE HEALTH INSURANCE | Attending: Emergency Medicine | Admitting: Emergency Medicine

## 2022-10-15 DIAGNOSIS — R599 Enlarged lymph nodes, unspecified: Secondary | ICD-10-CM | POA: Diagnosis not present

## 2022-10-15 DIAGNOSIS — R221 Localized swelling, mass and lump, neck: Secondary | ICD-10-CM | POA: Diagnosis present

## 2022-10-15 DIAGNOSIS — R590 Localized enlarged lymph nodes: Secondary | ICD-10-CM

## 2022-10-15 LAB — CBC WITH DIFFERENTIAL/PLATELET
Abs Immature Granulocytes: 0.01 10*3/uL (ref 0.00–0.07)
Basophils Absolute: 0 10*3/uL (ref 0.0–0.1)
Basophils Relative: 1 %
Eosinophils Absolute: 0.3 10*3/uL (ref 0.0–0.5)
Eosinophils Relative: 6 %
HCT: 42.5 % (ref 39.0–52.0)
Hemoglobin: 13.9 g/dL (ref 13.0–17.0)
Immature Granulocytes: 0 %
Lymphocytes Relative: 39 %
Lymphs Abs: 2 10*3/uL (ref 0.7–4.0)
MCH: 27.9 pg (ref 26.0–34.0)
MCHC: 32.7 g/dL (ref 30.0–36.0)
MCV: 85.3 fL (ref 80.0–100.0)
Monocytes Absolute: 0.4 10*3/uL (ref 0.1–1.0)
Monocytes Relative: 7 %
Neutro Abs: 2.4 10*3/uL (ref 1.7–7.7)
Neutrophils Relative %: 47 %
Platelets: 186 10*3/uL (ref 150–400)
RBC: 4.98 MIL/uL (ref 4.22–5.81)
RDW: 13.2 % (ref 11.5–15.5)
WBC: 5.1 10*3/uL (ref 4.0–10.5)
nRBC: 0 % (ref 0.0–0.2)

## 2022-10-15 LAB — COMPREHENSIVE METABOLIC PANEL
ALT: 17 U/L (ref 0–44)
AST: 17 U/L (ref 15–41)
Albumin: 4.7 g/dL (ref 3.5–5.0)
Alkaline Phosphatase: 64 U/L (ref 38–126)
Anion gap: 9 (ref 5–15)
BUN: 12 mg/dL (ref 6–20)
CO2: 25 mmol/L (ref 22–32)
Calcium: 9.6 mg/dL (ref 8.9–10.3)
Chloride: 105 mmol/L (ref 98–111)
Creatinine, Ser: 1.2 mg/dL (ref 0.61–1.24)
GFR, Estimated: >60 mL/min (ref >60)
Glucose, Bld: 94 mg/dL (ref 70–99)
Potassium: 4.1 mmol/L (ref 3.5–5.1)
Sodium: 139 mmol/L (ref 135–145)
Total Bilirubin: 0.4 mg/dL (ref 0.3–1.2)
Total Protein: 7.2 g/dL (ref 6.5–8.1)

## 2022-10-15 NOTE — ED Triage Notes (Addendum)
Pt has mass/lump on the right side of his neck x 3 days. Pt reports it is painful with palpation.

## 2022-10-15 NOTE — ED Provider Notes (Signed)
  [facility] Provider Note   CSN: [id number] Arrival date & time: 10/15/22  1600     History {Add pertinent medical, surgical, social history, OB history to HPI:1} Chief Complaint  Patient presents with   Mass    Adrian Mcpherson is a 32 y.o. male.  HPI     Home Medications Prior to Admission medications   Not on File      Allergies    Patient has no known allergies.    Review of Systems   Review of Systems  Physical Exam Updated Vital Signs BP (!) 146/91 (BP Location: Right Arm)   Pulse 76   Temp 98.3 F (36.8 C) (Oral)   Resp 15   SpO2 100%  Physical Exam  ED Results / Procedures / Treatments   Labs (all labs ordered are listed, but only abnormal results are displayed) Labs Reviewed  CBC WITH DIFFERENTIAL/PLATELET  COMPREHENSIVE METABOLIC PANEL    EKG None  Radiology No results found.  Procedures Procedures  {Document cardiac monitor, telemetry assessment procedure when appropriate:1}  Medications Ordered in ED Medications - No data to display  ED Course/ Medical Decision Making/ A&P   {   Click here for ABCD2, HEART and other calculatorsREFRESH Note before signing :1}                          Medical Decision Making Amount and/or Complexity of Data Reviewed Labs: ordered.   ***  {Document critical care time when appropriate:1} {Document review of labs and clinical decision tools ie heart score, Chads2Vasc2 etc:1}  {Document your independent review of radiology images, and any outside records:1} {Document your discussion with family members, caretakers, and with consultants:1} {Document social determinants of health affecting pt's care:1} {Document your decision making why or why not admission, treatments were needed:1} Final Clinical Impression(s) / ED Diagnoses Final diagnoses:  None    Rx / DC Orders ED Discharge Orders     None

## 2022-10-15 NOTE — Discharge Instructions (Signed)
You were seen in the ER for evaluation of your enlarged lymph node on your neck. Your lab work was unremarkable today, I would like for you to follow up with your PCP about this.  If you do not have a primary care doctor, please call the [place] [facility] that is been included in this report.  You will likely need to have this monitored.  You can apply warm compresses and take Tylenol or ibuprofen as needed.  If you have any concerns, new or worsening symptoms, please turn to the nearest emergency room for evaluation.  Contact a health care provider if you have: Lymph glands that: Are still swollen after 2 weeks. Have suddenly gotten bigger or the swelling spreads. Are red, painful, or hard. Fluid leaking from the skin near an enlarged lymph gland. Problems with breathing. A fever, chills, or night sweats. Fatigue. A sore throat. Pain in your abdomen. Weight loss. Get help right away if you have: Severe pain. Chest pain. Shortness of breath. These symptoms may represent a serious problem that is an emergency. Do not wait to see if the symptoms will go away. Get medical help right away. Call your local emergency services (911 in the [place] Do not drive yourself to the hospital.

## 2023-01-20 IMAGING — CT CT RENAL STONE PROTOCOL
2 of 4 series · 16 of 46 positions shown, 18 images · non-contrast
Comparison: None.

CLINICAL DATA: Hematuria and abdominal pain.

EXAM:
CT ABDOMEN AND PELVIS WITHOUT CONTRAST
TECHNIQUE: Multidetector CT imaging of the abdomen and pelvis was performed
following the standard protocol without IV contrast.

[Series 3: renal stone 5.0 · axial · 0.87mm/px · z∈[-1109,-649]mm · 13 of 100 slices shown, 15 images]
[im 4/100  soft-tissue]
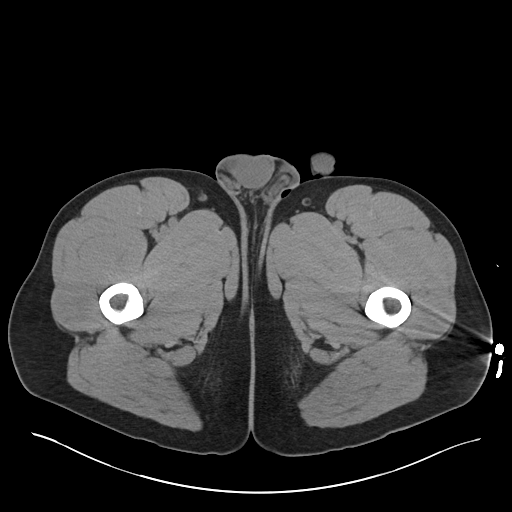
[im 4/100  bone]
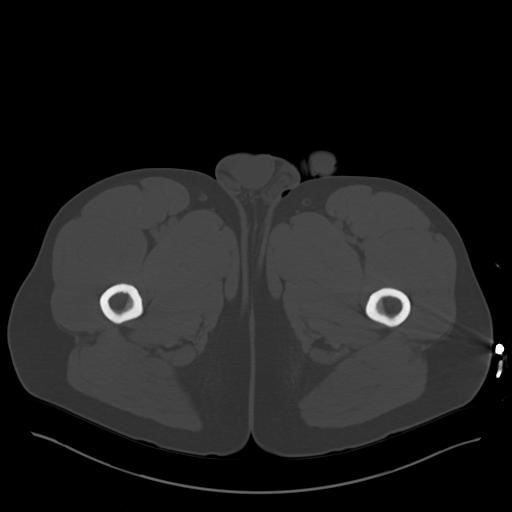
[im 12/100  soft-tissue]
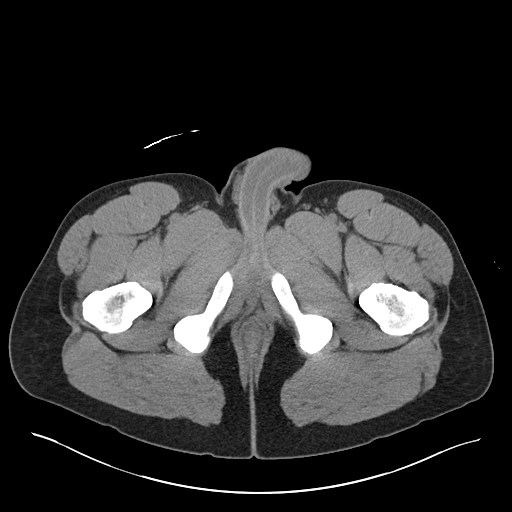
[im 20/100  soft-tissue]
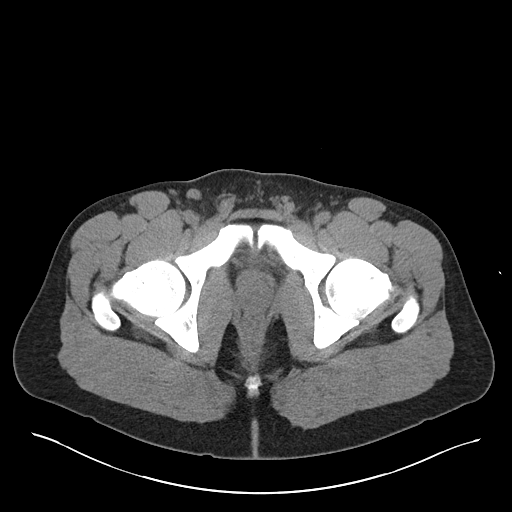
[im 28/100  soft-tissue]
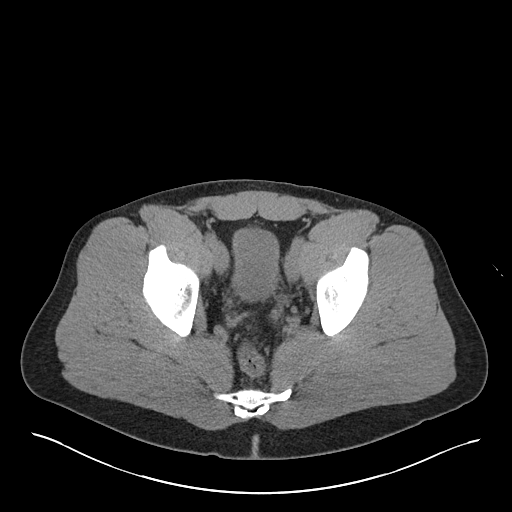
[im 36/100  soft-tissue]
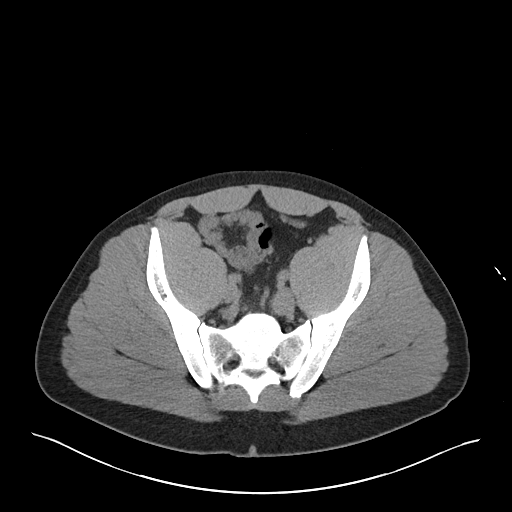
[im 44/100  soft-tissue]
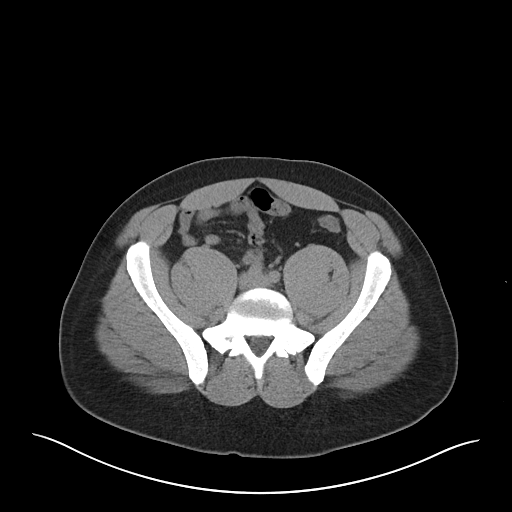
[im 52/100  soft-tissue]
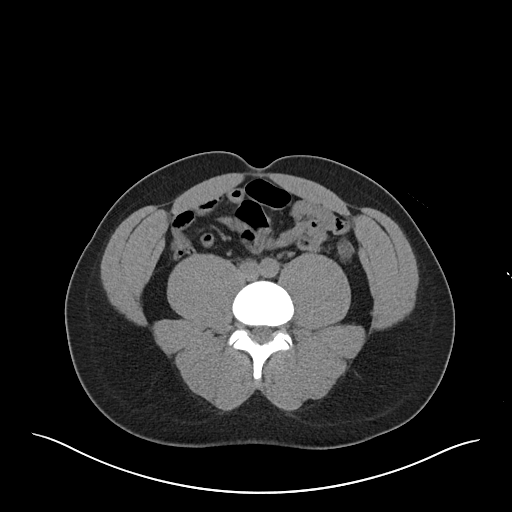
[im 56/100  soft-tissue]
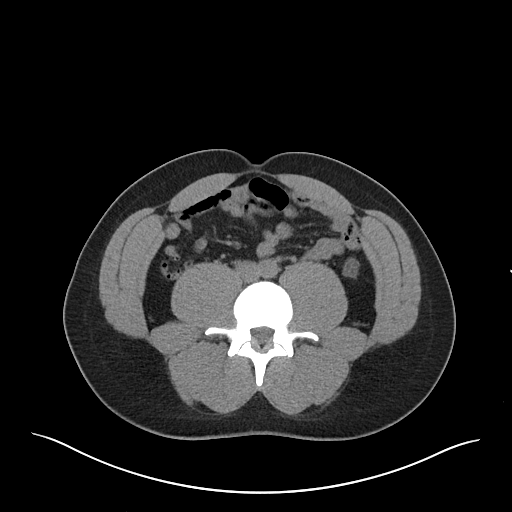
[im 64/100  soft-tissue]
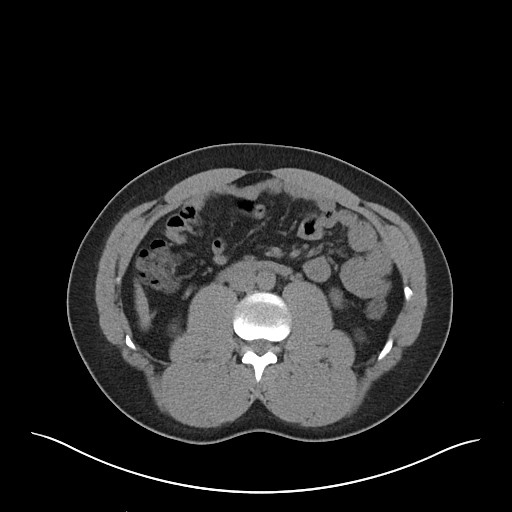
[im 64/100  bone]
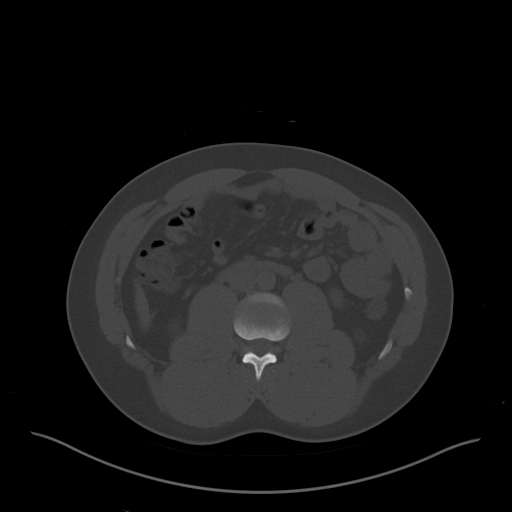
[im 72/100  soft-tissue]
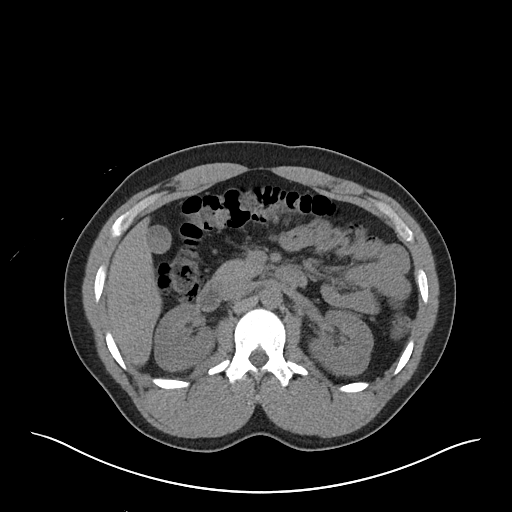
[im 80/100  soft-tissue]
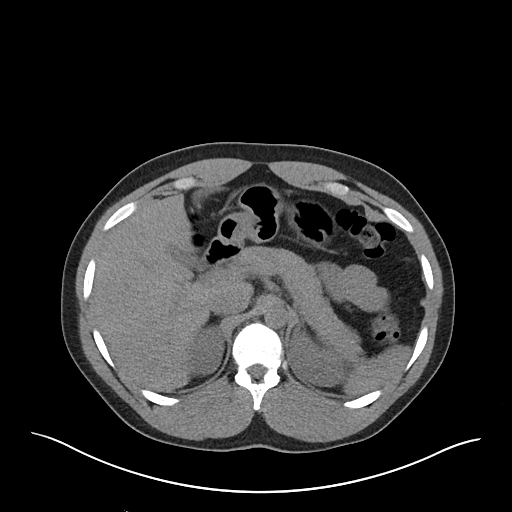
[im 88/100  soft-tissue]
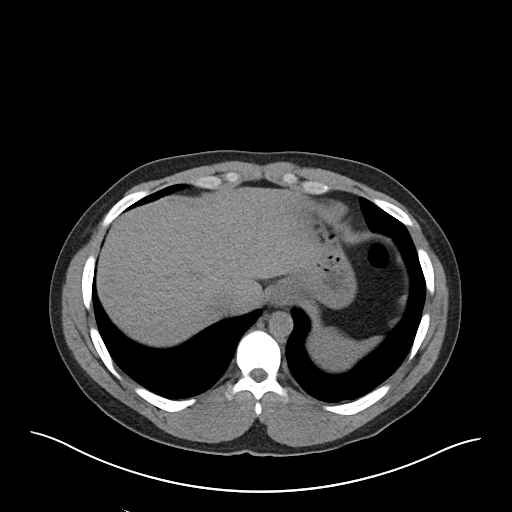
[im 96/100  soft-tissue]
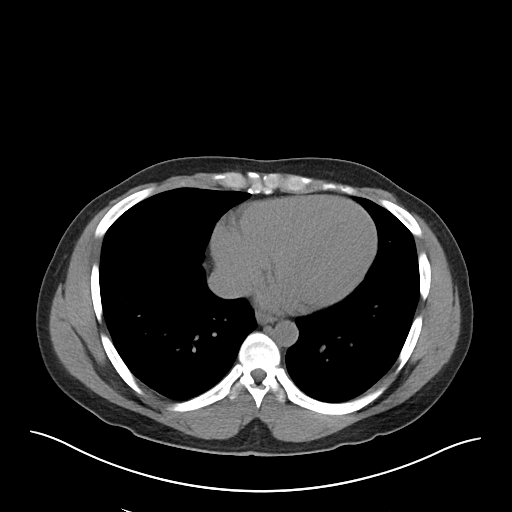

[Series 6: cor · coronal · 0.76mm/px · 3 of 126 slices shown]
[im 42/126  soft-tissue]
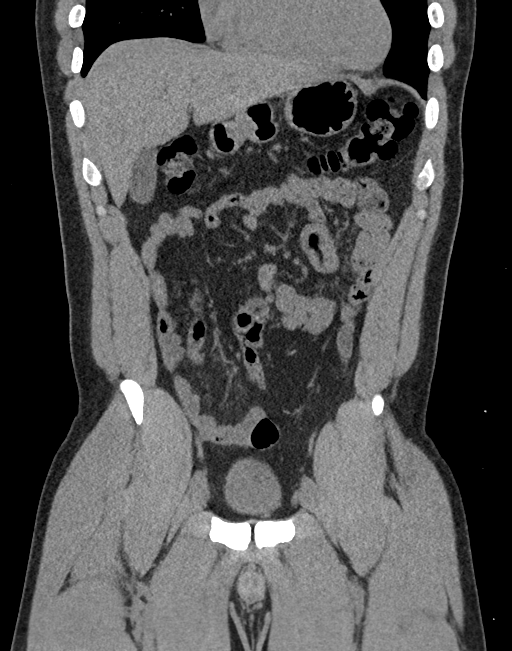
[im 56/126  soft-tissue]
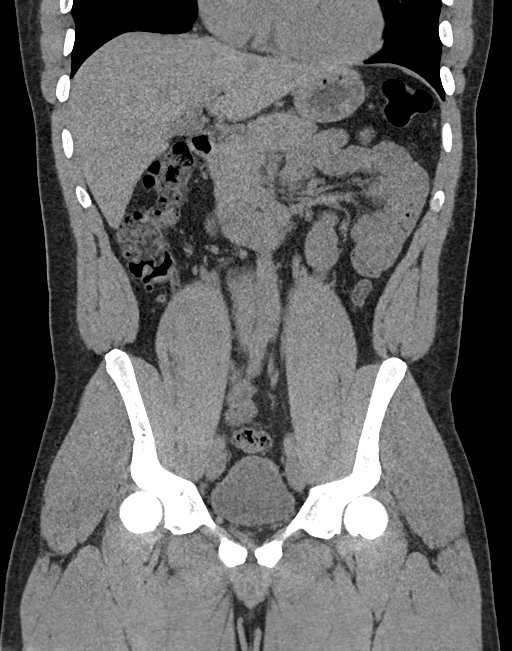
[im 70/126  soft-tissue]
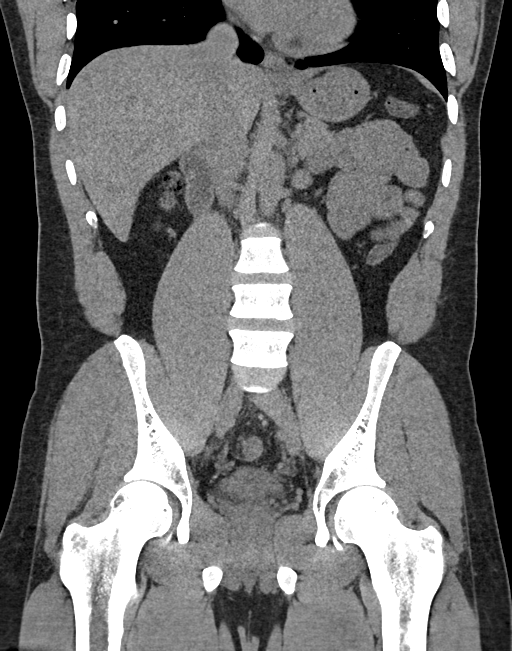

[16 of 46 positions shown; findings below may reference images not displayed]

FINDINGS: Lower chest: No acute abnormality.

Hepatobiliary: No focal liver abnormality is seen. No gallstones,
gallbladder wall thickening, or biliary dilatation.

Pancreas: Unremarkable. No pancreatic ductal dilatation or
surrounding inflammatory changes.

Spleen: Normal in size without focal abnormality.

Adrenals/Urinary Tract: Adrenal glands are unremarkable. Kidneys are
normal, without renal calculi, focal lesion, or hydronephrosis.
Bladder is unremarkable.

Stomach/Bowel: Stomach is within normal limits. Appendix appears
normal. No evidence of bowel wall thickening, distention, or
inflammatory changes.

Vascular/Lymphatic: No significant vascular findings are present. No
enlarged abdominal or pelvic lymph nodes.

Reproductive: Prostate is unremarkable.

Other: There is a small fat containing umbilical hernia. There is no
free fluid or free air.

Musculoskeletal: No acute or significant osseous findings.
IMPRESSION: No acute localizing process in the abdomen or pelvis.

## 2024-02-27 ENCOUNTER — Emergency Department (HOSPITAL_COMMUNITY)
Admission: EM | Admit: 2024-02-27 | Discharge: 2024-02-28 | Disposition: A | Discharge status: Other Acute Inpatient Hospital | Payer: Self-pay | Attending: Emergency Medicine | Admitting: Emergency Medicine

## 2024-02-27 DIAGNOSIS — T1491XA Suicide attempt, initial encounter: Secondary | ICD-10-CM

## 2024-02-27 DIAGNOSIS — T39392A Poisoning by other nonsteroidal anti-inflammatory drugs [NSAID], intentional self-harm, initial encounter: Secondary | ICD-10-CM | POA: Insufficient documentation

## 2024-02-27 DIAGNOSIS — T465X1A Poisoning by other antihypertensive drugs, accidental (unintentional), initial encounter: Secondary | ICD-10-CM | POA: Insufficient documentation

## 2024-02-27 DIAGNOSIS — T465X2A Poisoning by other antihypertensive drugs, intentional self-harm, initial encounter: Secondary | ICD-10-CM | POA: Insufficient documentation

## 2024-02-27 DIAGNOSIS — F430 Acute stress reaction: Secondary | ICD-10-CM

## 2024-02-27 DIAGNOSIS — T50902A Poisoning by unspecified drugs, medicaments and biological substances, intentional self-harm, initial encounter: Secondary | ICD-10-CM

## 2024-02-27 LAB — CBC
HCT: 42.3 % (ref 39.0–52.0)
Hemoglobin: 13.7 g/dL (ref 13.0–17.0)
MCH: 28.7 pg (ref 26.0–34.0)
MCHC: 32.4 g/dL (ref 30.0–36.0)
MCV: 88.5 fL (ref 80.0–100.0)
Platelets: 175 10*3/uL (ref 150–400)
RBC: 4.78 MIL/uL (ref 4.22–5.81)
RDW: 13 % (ref 11.5–15.5)
WBC: 6.1 10*3/uL (ref 4.0–10.5)
nRBC: 0 % (ref 0.0–0.2)

## 2024-02-27 LAB — COMPREHENSIVE METABOLIC PANEL WITH GFR
ALT: 17 U/L (ref 0–44)
AST: 22 U/L (ref 15–41)
Albumin: 4.3 g/dL (ref 3.5–5.0)
Alkaline Phosphatase: 46 U/L (ref 38–126)
Anion gap: 10 (ref 5–15)
BUN: 12 mg/dL (ref 6–20)
CO2: 25 mmol/L (ref 22–32)
Calcium: 9.3 mg/dL (ref 8.9–10.3)
Chloride: 106 mmol/L (ref 98–111)
Creatinine, Ser: 1.63 mg/dL — ABNORMAL HIGH (ref 0.61–1.24)
GFR, Estimated: 56 mL/min — ABNORMAL LOW (ref >60)
Glucose, Bld: 92 mg/dL (ref 70–99)
Potassium: 4.1 mmol/L (ref 3.5–5.1)
Sodium: 141 mmol/L (ref 135–145)
Total Bilirubin: 1.1 mg/dL (ref 0.0–1.2)
Total Protein: 6.8 g/dL (ref 6.5–8.1)

## 2024-02-27 LAB — SALICYLATE LEVEL: Salicylate Lvl: <7 mg/dL — ABNORMAL LOW (ref 7.0–30.0)

## 2024-02-27 LAB — RAPID URINE DRUG SCREEN, HOSP PERFORMED
Amphetamines: NOT DETECTED
Barbiturates: NOT DETECTED
Benzodiazepines: NOT DETECTED
Cocaine: NOT DETECTED
Opiates: NOT DETECTED
Tetrahydrocannabinol: POSITIVE — AB

## 2024-02-27 LAB — ETHANOL: Alcohol, Ethyl (B): <15 mg/dL (ref <15)

## 2024-02-27 LAB — ACETAMINOPHEN LEVEL: Acetaminophen (Tylenol), Serum: <10 ug/mL — ABNORMAL LOW (ref 10–30)

## 2024-02-27 NOTE — ED Notes (Signed)
 Patient transferred to room 50, currently sitting in bed watching television, in NAD, states no needs at this time.

## 2024-02-27 NOTE — ED Triage Notes (Signed)
 Patient BIB BHRT team after intentional OD with unknown amount of medication. Patient reports taking 8 aleve, but sent pictures of hydrochlorothiazide and aleve to his gf, 6 hydrochlorothiazide and 2 aleve. Patient's gf ended things and is moving away and he felt abandoned. Patient has been cooperative with BHRT and is voluntary.

## 2024-02-27 NOTE — BH Assessment (Addendum)
 Pt has been referred to [facility] telecare for his teleassessment.  Iris coordinator will reach out with a time and provider ot see patient.

## 2024-02-27 NOTE — ED Notes (Signed)
 Patient resting in bed, in NAD, visible chest rise noted, in view of safety sitter.

## 2024-02-27 NOTE — ED Provider Triage Note (Signed)
 Emergency Medicine Provider Triage Evaluation Note  RESEAN BRANDER , a 33 y.o. male  was evaluated in triage.  Pt complains of medication overdose, at 1630, took 8 Tylenol  and 8 naproxen in an attempt to commit suicide due to an acute bout of depression.  When asked he currently does not have suicidal ideation, stating that it was a moment of severe depression brought on by separation from his girlfriend who is going to be moving to another part of the state.  He had some abdominal pain, upper abdominal pain, but has not had any of this recently.  Review of Systems  Positive: As above Negative:   Physical Exam  BP (!) 144/99 (BP Location: Left Arm)   Pulse 78   Temp 99.1 F (37.3 C) (Oral)   Resp 16   Ht 6' (1.829 m)   Wt 99.8 kg   SpO2 100%   BMI 29.84 kg/m  Gen:   Awake, no distress   Resp:  Normal effort  MSK:   Moves extremities without difficulty  Other:    Medical Decision Making  Medically screening exam initiated at 8:42 PM.  Appropriate orders placed.  Welton JONETTA Doss was informed that the remainder of the evaluation will be completed by another provider, this initial triage assessment does not replace that evaluation, and the importance of remaining in the ED until their evaluation is complete.  Medical clearance order set placed.   Myriam Dorn BROCKS, [place] 02/27/24 2044

## 2024-02-27 NOTE — ED Provider Notes (Signed)
  [place] EMERGENCY DEPARTMENT AT [facility] Provider Note   CSN: [id number] Arrival date & time: 02/27/24  1739     Patient presents with: Suicide Attempt   Adrian Mcpherson is a 33 y.o. male.   HPI Patient presents after overdose.  Reportedly around 430 took a few hydrochlorothiazide and a couple naproxen.  Although some dispute over what he actually took.  He took it in a suicide attempt after separation of his girlfriend.  Denies other substance use.  Denies history of depression.   No past medical history on file.  Prior to Admission medications   Not on File    Allergies: Patient has no known allergies.    Review of Systems  Updated Vital Signs BP (!) 144/99 (BP Location: Left Arm)   Pulse 78   Temp 99.1 F (37.3 C) (Oral)   Resp 16   Ht 6' (1.829 m)   Wt 99.8 kg   SpO2 100%   BMI 29.84 kg/m   Physical Exam Vitals and nursing note reviewed.   Cardiovascular:     Rate and Rhythm: Normal rate.  Pulmonary:     Breath sounds: No wheezing.  Abdominal:     Tenderness: There is no abdominal tenderness.   Skin:    General: Skin is warm.   Neurological:     Mental Status: He is alert.   Psychiatric:     Comments: Patient is tearful.     (all labs ordered are listed, but only abnormal results are displayed) Labs Reviewed  COMPREHENSIVE METABOLIC PANEL WITH GFR - Abnormal; Notable for the following components:      Result Value   Creatinine, Ser 1.63 (*)    GFR, Estimated 56 (*)    All other components within normal limits  SALICYLATE LEVEL - Abnormal; Notable for the following components:   Salicylate Lvl <7.0 (*)    All other components within normal limits  ACETAMINOPHEN  LEVEL - Abnormal; Notable for the following components:   Acetaminophen  (Tylenol ), Serum <10 (*)    All other components within normal limits  ETHANOL  CBC  RAPID URINE DRUG SCREEN, HOSP PERFORMED    EKG: EKG Interpretation Date/Time:  Monday February 27 2024  21:14:28 EDT Ventricular Rate:  52 PR Interval:  144 QRS Duration:  84 QT Interval:  420 QTC Calculation: 390 R Axis:   77  Text Interpretation: Sinus bradycardia Otherwise normal ECG No previous ECGs available Confirmed by Patsey Lot [id number]) on 02/27/2024 10:38:38 PM  Radiology: No results found.   Procedures   Medications Ordered in the ED - No data to display                                  Medical Decision Making Amount and/or Complexity of Data Reviewed Labs: ordered.   Patient with overdose.  Potentially had some NSAIDs and some hydrochlorothiazide.  Also potentially Tylenol .  Electrolytes are reassuring.  Vitals reassuring.  Patient is tearful.  Negative Tylenol  and salicylate level.  Creatinine is mildly increased.  Will need to be followed as an outpatient but patient is medically cleared at this time.      Final diagnoses:  Suicide attempt [facility])    ED Discharge Orders     None          Patsey Lot, MD 02/27/24 2239

## 2024-02-28 ENCOUNTER — Other Ambulatory Visit (HOSPITAL_COMMUNITY): Payer: Self-pay

## 2024-02-28 ENCOUNTER — Encounter (HOSPITAL_COMMUNITY): Payer: Self-pay | Admitting: Psychiatric/Mental Health

## 2024-02-28 ENCOUNTER — Inpatient Hospital Stay (HOSPITAL_COMMUNITY)
Admission: AD | Admit: 2024-02-28 | Discharge: 2024-03-02 | DRG: 881 | Disposition: A | Discharge status: Home / Self Care | Payer: PRIVATE HEALTH INSURANCE | Source: Intra-hospital | Attending: Psychiatry | Admitting: Psychiatry

## 2024-02-28 ENCOUNTER — Other Ambulatory Visit: Payer: Self-pay

## 2024-02-28 ENCOUNTER — Encounter (HOSPITAL_COMMUNITY): Payer: Self-pay | Admitting: Nurse Practitioner

## 2024-02-28 DIAGNOSIS — F320 Major depressive disorder, single episode, mild: Secondary | ICD-10-CM | POA: Diagnosis not present

## 2024-02-28 DIAGNOSIS — I10 Essential (primary) hypertension: Secondary | ICD-10-CM | POA: Diagnosis present

## 2024-02-28 DIAGNOSIS — Z9151 Personal history of suicidal behavior: Secondary | ICD-10-CM | POA: Diagnosis not present

## 2024-02-28 DIAGNOSIS — F430 Acute stress reaction: Secondary | ICD-10-CM

## 2024-02-28 DIAGNOSIS — F329 Major depressive disorder, single episode, unspecified: Secondary | ICD-10-CM | POA: Diagnosis present

## 2024-02-28 DIAGNOSIS — T50902A Poisoning by unspecified drugs, medicaments and biological substances, intentional self-harm, initial encounter: Secondary | ICD-10-CM

## 2024-02-28 LAB — ACETAMINOPHEN LEVEL: Acetaminophen (Tylenol), Serum: <10 ug/mL — ABNORMAL LOW (ref 10–30)

## 2024-02-28 MED ORDER — DIPHENHYDRAMINE HCL 50 MG/ML IJ SOLN: 50.0000 mg | Freq: Three times a day (TID) | INTRAMUSCULAR | Status: DC | PRN

## 2024-02-28 MED ORDER — AMLODIPINE BESYLATE 5 MG PO TABS
5.0000 mg | ORAL_TABLET | Freq: Every day | ORAL | Status: DC
  Administered 2024-02-28: 5 mg via ORAL
  Filled 2024-02-28: qty 1

## 2024-02-28 MED ORDER — ACETAMINOPHEN 325 MG PO TABS: 650.0000 mg | ORAL_TABLET | Freq: Four times a day (QID) | ORAL | Status: DC | PRN

## 2024-02-28 MED ORDER — HALOPERIDOL LACTATE 5 MG/ML IJ SOLN: 10.0000 mg | Freq: Three times a day (TID) | INTRAMUSCULAR | Status: DC | PRN

## 2024-02-28 MED ORDER — LORAZEPAM 2 MG/ML IJ SOLN: 2.0000 mg | Freq: Three times a day (TID) | INTRAMUSCULAR | Status: DC | PRN

## 2024-02-28 MED ORDER — AMLODIPINE BESYLATE 5 MG PO TABS
5.0000 mg | ORAL_TABLET | Freq: Every day | ORAL | 0 refills | Status: DC
Start: 2024-02-28 — End: 2024-03-02
  Filled 2024-02-28: qty 30, 30d supply, fill #0

## 2024-02-28 MED ORDER — HALOPERIDOL LACTATE 5 MG/ML IJ SOLN: 5.0000 mg | Freq: Three times a day (TID) | INTRAMUSCULAR | Status: DC | PRN

## 2024-02-28 MED ORDER — TRAZODONE HCL 50 MG PO TABS
50.0000 mg | ORAL_TABLET | Freq: Every evening | ORAL | Status: DC | PRN
  Filled 2024-02-28: qty 1

## 2024-02-28 MED ORDER — AMLODIPINE BESYLATE 5 MG PO TABS
5.0000 mg | ORAL_TABLET | Freq: Every day | ORAL | Status: DC
  Administered 2024-02-29 – 2024-03-02 (×3): 5 mg via ORAL
  Filled 2024-02-28 (×3): qty 1

## 2024-02-28 MED ORDER — OLANZAPINE 10 MG IM SOLR: 10.0000 mg | Freq: Once | INTRAMUSCULAR | Status: DC | PRN

## 2024-02-28 MED ORDER — LORAZEPAM 1 MG PO TABS: 1.0000 mg | ORAL_TABLET | Freq: Four times a day (QID) | ORAL | Status: DC | PRN

## 2024-02-28 MED ORDER — DIPHENHYDRAMINE HCL 25 MG PO CAPS: 50.0000 mg | ORAL_CAPSULE | Freq: Three times a day (TID) | ORAL | Status: DC | PRN

## 2024-02-28 MED ORDER — ALUM & MAG HYDROXIDE-SIMETH 200-200-20 MG/5ML PO SUSP: 30.0000 mL | ORAL | Status: DC | PRN

## 2024-02-28 MED ORDER — HALOPERIDOL 5 MG PO TABS: 5.0000 mg | ORAL_TABLET | Freq: Three times a day (TID) | ORAL | Status: DC | PRN

## 2024-02-28 MED ORDER — HYDROXYZINE HCL 25 MG PO TABS
25.0000 mg | ORAL_TABLET | Freq: Three times a day (TID) | ORAL | Status: DC | PRN
  Filled 2024-02-28: qty 1

## 2024-02-28 MED ORDER — MAGNESIUM HYDROXIDE 400 MG/5ML PO SUSP: 30.0000 mL | Freq: Every day | ORAL | Status: DC | PRN

## 2024-02-28 NOTE — ED Notes (Signed)
 [facility] EFILED WAITING FOR ACCEPTANCE

## 2024-02-28 NOTE — ED Provider Notes (Signed)
 Emergency Medicine Observation Re-evaluation Note  Adrian Mcpherson is a 33 y.o. male, seen on rounds today.  Pt initially presented to the ED for complaints of Suicide Attempt Currently, the patient is in room without complaint.  Physical Exam  BP (!) 144/99 (BP Location: Left Arm)   Pulse 78   Temp 99.1 F (37.3 C) (Oral)   Resp 16   Ht 6' (1.829 m)   Wt 99.8 kg   SpO2 100%   BMI 29.84 kg/m  Physical Exam General: Awake, alert Cardiac: Normal rate Lungs: Normal effort Psych: Mood appropriate  ED Course / MDM  EKG:EKG Interpretation Date/Time:  Monday February 27 2024 21:14:28 EDT Ventricular Rate:  52 PR Interval:  144 QRS Duration:  84 QT Interval:  420 QTC Calculation: 390 R Axis:   77  Text Interpretation: Sinus bradycardia Otherwise normal ECG No previous ECGs available Confirmed by Patsey Lot [phone]) on 02/27/2024 10:38:38 PM  I have reviewed the labs performed to date as well as medications administered while in observation.  Recent changes in the last 24 hours include lab review showing slightly worsening GFR, likely some degree of chronic kidney disease.  Patient noted to have it appears to be chronic hypertension, likely contributing to his kidney disease.  Will start the patient on 5 mg of amlodipine daily.  Plan  Current plan is for placement.    Mannie Pac T, DO 02/28/24 RAELENE

## 2024-02-28 NOTE — Progress Notes (Signed)
 Pt has been accepted to [facility] on 02/28/2024 Bed assignment: 400-01  Pt meets inpatient criteria per: Nash Batter NP  Attending Physician will be: Dr. Prentis    Report can be called to: Adult unit: [phone]  Pt can arrive after (pending items are received/pending discharges)  Care Team Notified: [facility] Cherylynn Ernst RN, Nash Batter NP, [facility] RN   [place] Zeina Akkerman LCSW-A   02/28/2024 9:50 AM

## 2024-02-28 NOTE — Plan of Care (Signed)
   Problem: Education: Goal: Knowledge of [place] [facility] Education information/materials will improve Outcome: Progressing Goal: Verbalization of understanding the information provided will improve Outcome: Progressing

## 2024-02-28 NOTE — ED Notes (Signed)
 Patient was given [place] sandwich and cola to drink

## 2024-02-28 NOTE — ED Notes (Signed)
 Report given to Jacquelyne Hind, RN at 11:11 am from [facility]

## 2024-02-28 NOTE — Tx Team (Cosign Needed)
 Initial Treatment Plan 02/28/2024 2:40 PM PERRION DIESEL [id number]    PATIENT STRESSORS: Financial difficulties   Loss of relationship   Occupational concerns     PATIENT STRENGTHS: Ability for insight  Average or above average intelligence  Capable of independent living  Supportive family/friends    PATIENT IDENTIFIED PROBLEMS: Impulsiveness  Living with mom and would like to find a place.  Starting a new job on Monday  Unexpected breakup with girlfriend               DISCHARGE CRITERIA:  Improved stabilization in mood, thinking, and/or behavior Motivation to continue treatment in a less acute level of care Need for constant or close observation no longer present Verbal commitment to aftercare and medication compliance  PRELIMINARY DISCHARGE PLAN: Outpatient therapy Return to previous living arrangement  PATIENT/FAMILY INVOLVEMENT: This treatment plan has been presented to and reviewed with the patient, Adrian Mcpherson. The patient has been given the opportunity to ask questions and make suggestions.  Inocente JINNY Cassette, RN 02/28/2024, 2:40 PM

## 2024-02-28 NOTE — ED Notes (Signed)
 Patient in psychiatric telemedicine consult.

## 2024-02-28 NOTE — ED Notes (Signed)
 IV[facility] PAPERWORK HANDED TO ME UPON ARRIVAL UNFINISHED AND NO NOTES LEFT IN CHART ADDRESS OF PATIENT NOT WRITTEN CLEAR NOR NOTORIZED.

## 2024-02-28 NOTE — ED Notes (Signed)
 Patient resting in bed, in NAD, visible chest rise noted.

## 2024-02-28 NOTE — ED Notes (Signed)
[facility] called for transport to [facility] ?

## 2024-02-28 NOTE — ED Notes (Signed)
 [facility] ACCEPTED WAITING ON [facility] TO COME SERVE PATIENT ENVELOPE#[id number] CASE #[id number] EXP 03/06/24

## 2024-02-28 NOTE — ED Notes (Addendum)
 Television turned off for consult.

## 2024-02-28 NOTE — ED Notes (Signed)
 Patient resting in bed, awaiting tele-psych  consult.

## 2024-02-28 NOTE — Consult Note (Signed)
 [facility] Consult Note  Patient Name: Adrian Mcpherson MRN: [id number] DOB: [birth year] DATE OF Consult: 02/28/2024  PRIMARY PSYCHIATRIC DIAGNOSES  1.  Intentional Overdose 2.  Acute Stress Reaction   RECOMMENDATIONS  Recommendations: Medication recommendations: Olanzapine 10mg  PO/IM Q6 PRN for agitation. Ativan 1mg  PO Q6H PRN for anxiety, agitation.  Non-Medication/therapeutic recommendations: Inpatient hospitalization. IVC if not VOL. Suicide precautions.  Is inpatient psychiatric hospitalization recommended for this patient? Yes (Explain why): Patient meets criteria for inpatient psychiatric admission. Patient meets criteria for IVC if not voluntary.  From a psychiatric perspective, is this patient appropriate for discharge to an outpatient setting/resource or other less restrictive environment for continued care?  No (Explain why): Patient meets criteria for inpatient psychiatry.  Follow-Up Telepsychiatry C/L services: We will continue to follow this patient with you until stabilized or discharged.  If you have any questions or concerns, please call our TeleCare Coordination service at  [phone] and ask for myself or the provider on-call. Communication: Treatment team members (and family members if applicable) who were involved in treatment/care discussions and planning, and with whom we spoke or engaged with via secure text/chat, include the following: ED team per Epic Chat  Thank you for involving us  in the care of this patient. If you have any additional questions or concerns, please call [phone] and ask for me or the provider on-call.  TELEPSYCHIATRY ATTESTATION & CONSENT  As the provider for this telehealth consult, I attest that I verified the patient's identity using two separate identifiers, introduced myself to the patient, provided my credentials, disclosed my location, and performed this encounter via a HIPAA-compliant, real-time, face-to-face, two-way, interactive  audio and video platform and with the full consent and agreement of the patient (or guardian as applicable.)  Patient physical location: ED in [facility]. Telehealth provider physical location: home office in state of [place] .  Video start time: 0458 (Central Time) Video end time: 0508 (Central Time)  IDENTIFYING DATA  Adrian Mcpherson is a 33 y.o. year-old male for whom a psychiatric consultation has been ordered by the primary provider. The patient was identified using two separate identifiers.  CHIEF COMPLAINT/REASON FOR CONSULT  Intentional Overdose   HISTORY OF PRESENT ILLNESS (HPI)  The patient is a 33yo male with no prior psychiatric history, who presented to the emergency department following an intentional overdose in the setting of relationship break-up. Patient states yesterday he went to the home of his girlfriend to surprise her. She wasn't home and he called to see when she would return. Girlfriend stated she was out and would be moving to [place] today (Tuesday) to be closer to family. Patient states he was surprised by this news; did not know she was moving. States they are still in a relationship, denied any issues, denied recent separation. Patient states he felt upset at the time. Went back home and took 8 Tylenol  pills and 8 Aleve pills with intent to kill himself. Patient states he took pictures of the pills and sent a text message to his girlfriend stating he was going to kill himself, prior to ingestion. Patient admits to ingestion. He admits this was his first suicide attempt and denies passive SI. Denies current SI/HI. Feels remorseful at this time.   Patient states looking back he may have been trying to get her attention, to stop her from moving but continues to admit to suicide attempt.   No psychosis present, no symptoms indicative of mania. Denies drug and alcohol use.  PAST PSYCHIATRIC HISTORY  No prior psychiatric history.  No prior  hospitalizations.  No current medications.   Otherwise as per HPI above.  PAST MEDICAL HISTORY  History reviewed. No pertinent past medical history.   HOME MEDICATIONS     ALLERGIES  No Known Allergies  SOCIAL & SUBSTANCE USE HISTORY  Social History   Socioeconomic History   Marital status: Single    Spouse name: Not on file   Number of children: Not on file   Years of education: Not on file   Highest education level: Not on file  Occupational History   Not on file  Tobacco Use   Smoking status: Never   Smokeless tobacco: Never  Vaping Use   Vaping status: Never Used  Substance and Sexual Activity   Alcohol use: Never   Drug use: Never   Sexual activity: Not on file  Other Topics Concern   Not on file  Social History Narrative   Not on file   Social Drivers of Health   Financial Resource Strain: Not on file  Food Insecurity: Not on file  Transportation Needs: Not on file  Physical Activity: Not on file  Stress: Not on file  Social Connections: Not on file   Social History   Tobacco Use  Smoking Status Never  Smokeless Tobacco Never   Social History   Substance and Sexual Activity  Alcohol Use Never   Social History   Substance and Sexual Activity  Drug Use Never    Additional pertinent information: lives with mother and step-father  FAMILY HISTORY  Family History  Problem Relation Age of Onset   Healthy Mother    Healthy Father    Family Psychiatric History (if known):  Denies   MENTAL STATUS EXAM (MSE)  Mental Status Exam: General Appearance: Fairly Groomed  Orientation:  Full (Time, Place, and Person)  Memory:  Immediate;   Good Recent;   Good  Concentration:  Concentration: Good  Recall:  Good  Attention  Good  Eye Contact:  Good  Speech:  Clear and Coherent  Language:  Good  Volume:  Normal  Mood: alright  Affect:  Constricted  Thought Process:  Coherent  Thought Content:  Logical  Suicidal Thoughts:  Yes.  with intent/plan   Homicidal Thoughts:  No  Judgement:  Poor  Insight:  Lacking  Psychomotor Activity:  Normal  Akathisia:  No  Fund of Knowledge:  Good    Assets:  Communication Skills Desire for Improvement Financial Resources/Insurance Housing Physical Health Social Support  Cognition:  WNL  ADL's:  Intact  AIMS (if indicated):       VITALS  Blood pressure (!) 144/99, pulse 78, temperature 99.1 F (37.3 C), temperature source Oral, resp. rate 16, height 6' (1.829 m), weight 99.8 kg, SpO2 100%.  LABS  Admission on 02/27/2024  Component Date Value Ref Range Status   Sodium 02/27/2024 141  135 - 145 mmol/L Final   Potassium 02/27/2024 4.1  3.5 - 5.1 mmol/L Final   Chloride 02/27/2024 106  98 - 111 mmol/L Final   CO2 02/27/2024 25  22 - 32 mmol/L Final   Glucose, Bld 02/27/2024 92  70 - 99 mg/dL Final   Glucose reference range applies only to samples taken after fasting for at least 8 hours.   BUN 02/27/2024 12  6 - 20 mg/dL Final   Creatinine, Ser 02/27/2024 1.63 (H)  0.61 - 1.24 mg/dL Final   Calcium 93/69/7974 9.3  8.9 - 10.3 mg/dL  Final   Total Protein 02/27/2024 6.8  6.5 - 8.1 g/dL Final   Albumin 93/69/7974 4.3  3.5 - 5.0 g/dL Final   AST 93/69/7974 22  15 - 41 U/L Final   ALT 02/27/2024 17  0 - 44 U/L Final   Alkaline Phosphatase 02/27/2024 46  38 - 126 U/L Final   Total Bilirubin 02/27/2024 1.1  0.0 - 1.2 mg/dL Final   GFR, Estimated 02/27/2024 56 (L)  >60 mL/min Final   Comment: (NOTE) Calculated using the CKD-EPI Creatinine Equation (2021)    Anion gap 02/27/2024 10  5 - 15 Final   Performed at [facility], [address] [place], [place] [address]   Alcohol, Ethyl (B) 02/27/2024 <15  <15 mg/dL Final   Comment: (NOTE) For medical purposes only. Performed at [facility], [address] [place], [place] [address]    WBC 02/27/2024 6.1  4.0 - 10.5 K/uL Final   RBC 02/27/2024 4.78  4.22 - 5.81 MIL/uL Final   Hemoglobin 02/27/2024 13.7  13.0 - 17.0 g/dL Final    HCT 93/69/7974 42.3  39.0 - 52.0 % Final   MCV 02/27/2024 88.5  80.0 - 100.0 fL Final   MCH 02/27/2024 28.7  26.0 - 34.0 pg Final   MCHC 02/27/2024 32.4  30.0 - 36.0 g/dL Final   RDW 93/69/7974 13.0  11.5 - 15.5 % Final   Platelets 02/27/2024 175  150 - 400 K/uL Final   nRBC 02/27/2024 0.0  0.0 - 0.2 % Final   Performed at [facility], [address] [place], [place] [address]   Opiates 02/27/2024 NONE DETECTED  NONE DETECTED Final   Cocaine 02/27/2024 NONE DETECTED  NONE DETECTED Final   Benzodiazepines 02/27/2024 NONE DETECTED  NONE DETECTED Final   Amphetamines 02/27/2024 NONE DETECTED  NONE DETECTED Final   Tetrahydrocannabinol 02/27/2024 POSITIVE (A)  NONE DETECTED Final   Barbiturates 02/27/2024 NONE DETECTED  NONE DETECTED Final   Comment: (NOTE) DRUG SCREEN FOR MEDICAL PURPOSES ONLY.  IF CONFIRMATION IS NEEDED FOR ANY PURPOSE, NOTIFY LAB WITHIN 5 DAYS.  LOWEST DETECTABLE LIMITS FOR URINE DRUG SCREEN Drug Class                     Cutoff (ng/mL) Amphetamine and metabolites    1000 Barbiturate and metabolites    200 Benzodiazepine                 200 Opiates and metabolites        300 Cocaine and metabolites        300 THC                            50 Performed at [facility], [address] [place], [place] [address]    Salicylate Lvl 02/27/2024 <7.0 (L)  7.0 - 30.0 mg/dL Final   Performed at [facility], [address] [place], [place] [address]   Acetaminophen  (Tylenol ), Serum 02/27/2024 <10 (L)  10 - 30 ug/mL Final   Comment: (NOTE) Therapeutic concentrations vary significantly. A range of 10-30 ug/mL  may be an effective concentration for many patients. However, some  are best treated at concentrations outside of this range. Acetaminophen  concentrations >150 ug/mL at 4 hours after ingestion  and >50 ug/mL at 12 hours after ingestion are often associated with  toxic reactions.  Performed at [facility], [address],  [place], [place] [address]    Acetaminophen  (Tylenol ), Serum 02/27/2024 <10 (L)  10 - 30 ug/mL Final   Comment: (NOTE) Therapeutic concentrations vary significantly. A range of 10-30 ug/mL  may be an effective concentration for many patients. However, some  are best treated at concentrations outside of this range. Acetaminophen  concentrations >150 ug/mL at 4 hours after ingestion  and >50 ug/mL at 12 hours after ingestion are often associated with  toxic reactions.  Performed at [facility], [address], [place], [place] [address]     PSYCHIATRIC REVIEW OF SYSTEMS (ROS)  ROS: Notable for the following relevant positive findings: Review of Systems  Psychiatric/Behavioral:  Positive for suicidal ideas. The patient is nervous/anxious.     Additional findings:      Musculoskeletal: No abnormal movements observed      Gait & Station: Laying/Sitting      Pain Screening: Denies      Nutrition & Dental Concerns: No concerns at this time   RISK FORMULATION/ASSESSMENT  Is the patient experiencing any suicidal or homicidal ideations: Yes       Explain if yes: suicide attempt prior to arrival via overdose  Protective factors considered for safety management: access to care, family  Risk factors/concerns considered for safety management:  Impulsivity Male gender Unmarried  Is there a safety management plan with the patient and treatment team to minimize risk factors and promote protective factors: Yes           Explain: Currently in the ED Is crisis care placement or psychiatric hospitalization recommended: Yes     Based on my current evaluation and risk assessment, patient is determined at this time to be at:  High risk  *RISK ASSESSMENT Risk assessment is a dynamic process; it is possible that this patient's condition, and risk level, may change. This should be re-evaluated and managed over time as appropriate. Please re-consult psychiatric consult services if additional  assistance is needed in terms of risk assessment and management. If your team decides to discharge this patient, please advise the patient how to best access emergency psychiatric services, or to call 911, if their condition worsens or they feel unsafe in any way.   Darren Nodal E Evanny Ellerbe, NP Telepsychiatry Consult Services

## 2024-02-28 NOTE — ED Notes (Signed)
 Patient resting in bed in NAD, awaiting psych intake teleconference.

## 2024-02-28 NOTE — ED Notes (Signed)
 Patient resting in bed with eyes closed and visible chest rise noted, in NAD.

## 2024-02-28 NOTE — [facility] Group Notes (Signed)
 [facility] Group Notes:  (Nursing/MHT/Case Management/Adjunct)  Date:  02/28/2024  Time:  10:36 PM  Type of Therapy:  Psychoeducational Skills  Participation Level:  Minimal  Participation Quality:  Attentive  Affect:  Appropriate  Cognitive:  Lacking  Insight:  Limited  Engagement in Group:  Developing/Improving  Modes of Intervention:  Education  Summary of Progress/Problems: The patient rated his day as a 10 out of a possible 10. Patient states that he didn't have a goal for today and that he spent time getting to know his peers.   Davene Jobin S 02/28/2024, 10:36 PM

## 2024-02-28 NOTE — ED Notes (Signed)
 IVC COMPLETED FINDINGS AND CUSTODY UPLOADED AND FILED 3 COPIES ATTACHED TO CLIPBOARD IN PURPLE ZONE ORIGINAL COPY IN RED FOLDER AND 1 IN MED RECORDS DRAWER

## 2024-02-28 NOTE — Progress Notes (Signed)
 Pt admitted IVC to [facility] into room 400-1 on 02/28/24 for worsening depression and SA. Pt stated that he took 6 Advil  and 4 tylenol . Pt stated that his girlfriend suddenly broke up with him and he decided to take the pills and call her. Pt stressors are also: Money and his living situation. 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 He is to start a new job this coming Monday. Pt does not have any previous psych history and this is his first hospitalization. Admission complete. All consents are signed. Pt was oriented to the unit. Pt was given a lunch tray and beverage. Pt denies SI,HI, and AVH. Pt denies pain. Pt is on 15 minute continuous checks for safety. Pt remains safe. Will continue to monitor.

## 2024-02-29 ENCOUNTER — Encounter (HOSPITAL_COMMUNITY): Payer: Self-pay

## 2024-02-29 DIAGNOSIS — F320 Major depressive disorder, single episode, mild: Secondary | ICD-10-CM | POA: Diagnosis not present

## 2024-02-29 NOTE — Progress Notes (Signed)
   02/29/24 1000  Psych Admission Type (Psych Patients Only)  Admission Status Involuntary  Psychosocial Assessment  Patient Complaints Anxiety  Eye Contact Fair  Facial Expression Other (Comment) (WDL)  Affect Appropriate to circumstance  Speech Logical/coherent  Interaction Assertive  Motor Activity Other (Comment) (WNL)  Appearance/Hygiene Unremarkable  Behavior Characteristics Appropriate to situation  Mood Pleasant  Thought Process  Coherency WDL  Content WDL  Delusions None reported or observed  Perception WDL  Hallucination None reported or observed  Judgment WDL  Confusion None  Danger to Self  Current suicidal ideation? Denies  Agreement Not to Harm Self Yes  Description of Agreement Verbal Contract  Danger to Others  Danger to Others None reported or observed

## 2024-02-29 NOTE — BH IP Treatment Plan (Signed)
 Interdisciplinary Treatment and Diagnostic Plan Update  02/29/2024 Time of Session: 1044am CID AGENA MRN: [id number]  Principal Diagnosis: MDD (major depressive disorder)  Secondary Diagnoses: Principal Problem:   MDD (major depressive disorder)   Current Medications:  Current Facility-Administered Medications  Medication Dose Route Frequency Provider Last Rate Last Admin   acetaminophen  (TYLENOL ) tablet 650 mg  650 mg Oral Q6H PRN Mardy Legacy, NP       alum & mag hydroxide-simeth (MAALOX/MYLANTA) 200-200-20 MG/5ML suspension 30 mL  30 mL Oral Q4H PRN Mardy Legacy, NP       amLODipine (NORVASC) tablet 5 mg  5 mg Oral Daily Mardy Legacy, NP   5 mg at 02/29/24 0801   haloperidol (HALDOL) tablet 5 mg  5 mg Oral TID PRN Mardy Legacy, NP       And   diphenhydrAMINE (BENADRYL) capsule 50 mg  50 mg Oral TID PRN Mardy Legacy, NP       haloperidol lactate (HALDOL) injection 5 mg  5 mg Intramuscular TID PRN Mardy Legacy, NP       And   diphenhydrAMINE (BENADRYL) injection 50 mg  50 mg Intramuscular TID PRN Mardy Legacy, NP       And   LORazepam (ATIVAN) injection 2 mg  2 mg Intramuscular TID PRN Mardy Legacy, NP       haloperidol lactate (HALDOL) injection 10 mg  10 mg Intramuscular TID PRN Mardy Legacy, NP       And   diphenhydrAMINE (BENADRYL) injection 50 mg  50 mg Intramuscular TID PRN Mardy Legacy, NP       And   LORazepam (ATIVAN) injection 2 mg  2 mg Intramuscular TID PRN Mardy Legacy, NP       hydrOXYzine (ATARAX) tablet 25 mg  25 mg Oral TID PRN Mardy Legacy, NP       magnesium hydroxide (MILK OF MAGNESIA) suspension 30 mL  30 mL Oral Daily PRN Mardy Legacy, NP       traZODone (DESYREL) tablet 50 mg  50 mg Oral QHS PRN Mardy Legacy, NP       PTA Medications: Medications Prior to Admission  Medication Sig Dispense Refill Last Dose/Taking   amLODipine (NORVASC) 5 MG tablet Take 1 tablet (5 mg total) by mouth daily. 30  tablet 0     Patient Stressors: Financial difficulties   Loss of relationship   Occupational concerns    Patient Strengths: Ability for insight  Average or above average intelligence  Capable of independent living  Supportive family/friends   Treatment Modalities: Medication Management, Group therapy, Case management,  1 to 1 session with clinician, Psychoeducation, Recreational therapy.   Physician Treatment Plan for Primary Diagnosis: MDD (major depressive disorder) Long Term Goal(s): Improvement in symptoms so as ready for discharge   Short Term Goals: Ability to identify and develop effective coping behaviors will improve Ability to maintain clinical measurements within normal limits will improve Compliance with prescribed medications will improve Ability to identify triggers associated with substance abuse/mental health issues will improve Ability to identify changes in lifestyle to reduce recurrence of condition will improve Ability to verbalize feelings will improve Ability to disclose and discuss suicidal ideas Ability to demonstrate self-control will improve  Medication Management: Evaluate patient's response, side effects, and tolerance of medication regimen.  Therapeutic Interventions: 1 to 1 sessions, Unit Group sessions and Medication administration.  Evaluation of Outcomes: Not Progressing  Physician Treatment Plan for Secondary Diagnosis: Principal Problem:   MDD (major depressive disorder)  Long Term Goal(s): Improvement in symptoms so as ready for discharge   Short Term Goals: Ability to identify and develop effective coping behaviors will improve Ability to maintain clinical measurements within normal limits will improve Compliance with prescribed medications will improve Ability to identify triggers associated with substance abuse/mental health issues will improve Ability to identify changes in lifestyle to reduce recurrence of condition will  improve Ability to verbalize feelings will improve Ability to disclose and discuss suicidal ideas Ability to demonstrate self-control will improve     Medication Management: Evaluate patient's response, side effects, and tolerance of medication regimen.  Therapeutic Interventions: 1 to 1 sessions, Unit Group sessions and Medication administration.  Evaluation of Outcomes: Not Progressing   RN Treatment Plan for Primary Diagnosis: MDD (major depressive disorder) Long Term Goal(s): Knowledge of disease and therapeutic regimen to maintain health will improve  Short Term Goals: Ability to remain free from injury will improve, Ability to verbalize frustration and anger appropriately will improve, Ability to demonstrate self-control, Ability to participate in decision making will improve, Ability to verbalize feelings will improve, Ability to disclose and discuss suicidal ideas, Ability to identify and develop effective coping behaviors will improve, and Compliance with prescribed medications will improve  Medication Management: RN will administer medications as ordered by provider, will assess and evaluate patient's response and provide education to patient for prescribed medication. RN will report any adverse and/or side effects to prescribing provider.  Therapeutic Interventions: 1 on 1 counseling sessions, Psychoeducation, Medication administration, Evaluate responses to treatment, Monitor vital signs and CBGs as ordered, Perform/monitor CIWA, COWS, AIMS and Fall Risk screenings as ordered, Perform wound care treatments as ordered.  Evaluation of Outcomes: Not Progressing   LCSW Treatment Plan for Primary Diagnosis: MDD (major depressive disorder) Long Term Goal(s): Safe transition to appropriate next level of care at discharge, Engage patient in therapeutic group addressing interpersonal concerns.  Short Term Goals: Engage patient in aftercare planning with referrals and resources, Increase  social support, Increase ability to appropriately verbalize feelings, Increase emotional regulation, Facilitate acceptance of mental health diagnosis and concerns, Facilitate patient progression through stages of change regarding substance use diagnoses and concerns, Identify triggers associated with mental health/substance abuse issues, and Increase skills for wellness and recovery  Therapeutic Interventions: Assess for all discharge needs, 1 to 1 time with Social worker, Explore available resources and support systems, Assess for adequacy in community support network, Educate family and significant other(s) on suicide prevention, Complete Psychosocial Assessment, Interpersonal group therapy.  Evaluation of Outcomes: Not Progressing   Progress in Treatment: Attending groups: Yes. Participating in groups: Yes. Taking medication as prescribed: Yes. Toleration medication: Yes. Family/Significant other contact made: No, will contact:  consents pending Patient understands diagnosis: Yes. Discussing patient identified problems/goals with staff: Yes. Medical problems stabilized or resolved: Yes. Denies suicidal/homicidal ideation: Yes. Issues/concerns per patient self-inventory: No.  New problem(s) identified: No, Describe:  none  New Short Term/Long Term Goal(s): medication stabilization, elimination of SI thoughts, development of comprehensive mental wellness plan.    Patient Goals:  Work on coping with disappointment and discharging  Discharge Plan or Barriers: Patient recently admitted. CSW will continue to follow and assess for appropriate referrals and possible discharge planning.    Reason for Continuation of Hospitalization: Depression Medication stabilization Suicidal ideation  Estimated Length of Stay: 3-4 days  Last 3 [place] Suicide Severity Risk Score: Flowsheet Row [facility] ([facility] from 02/27/2024 in  [facility] at [facility] ED from 10/15/2022 in [facility] Emergency Department at [facility]  C-SSRS RISK CATEGORY High Risk High Risk No Risk    Last PHQ 2/9 Scores:     No data to display          Scribe for Treatment Team: Jenkins LULLA Morley ISRAEL 02/29/2024 12:23 PM

## 2024-02-29 NOTE — BHH Counselor (Signed)
 Adult Comprehensive Assessment  Patient ID: Adrian Mcpherson, male   DOB: May 24, 1991, 33 y.o.   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, theatre/television/film Family Relationships: None reported Surveyor, quantity / Lack of resources (include bankruptcy): None reported Housing / Lack of housing: None reported Physical health (include injuries & life threatening diseases): Yes, high blood pressure' Social relationships: None reported Substance abuse: None reported Bereavement / Loss: None reported  Living/Environment/Situation:  Living Arrangements: Parent Living conditions (as described by patient or guardian):  I olive with my mother and my step father Who else lives in the home?: mother and stepfather How long has patient lived in current situation?: 20 years What is atmosphere in current home: Comfortable, Paramedic  Family History:  Marital status: Single Are you sexually active?: Yes What is your sexual orientation?:  I like women Has your sexual activity been affected by drugs, alcohol, medication, or emotional stress?: None reported Does patient have children?: Yes How many children?: 3 How is patient's relationship with their children?:  we have a good relationship  Childhood History:  By whom was/is the patient raised?: Mother Additional childhood history information: None reported Description of patient's relationship with caregiver when they were a child:  good relationship Patient's description of current relationship with people who  raised him/her:  we have a good relationship How were you disciplined when you got in trouble as a child/adolescent?:  I was punished things taken away Does patient have siblings?: Yes Number of Siblings: 1 Description of patient's current relationship with siblings:  w ehave a good relationship Did patient suffer any verbal/emotional/physical/sexual abuse as a child?: No Did patient suffer from severe childhood neglect?: No Has patient ever been sexually abused/assaulted/raped as an adolescent or adult?: No Was the patient ever a victim of a crime or a disaster?: No Witnessed domestic violence?: No Has patient been affected by domestic violence as an adult?: No  Education:  Highest grade of school patient has completed: 12th Currently a student?: No Learning disability?: No  Employment/Work Situation:   Employment Situation: Employed Where is Patient Currently Employed?: [employer] How Long has Patient Been Employed?: 1 ths Are You Satisfied With Your Job?: Yes Do You Work More Than One Job?: No Work Stressors: None reported Patient's Job has Been Impacted by Current Illness: No What is the Longest Time Patient has Held a Job?: 7 yrs Where was the Patient Employed at that Time?: [employer] Has Patient ever Been in the [employer]?: No  Financial Resources:   Financial resources: Income from employment Does patient have a representative payee or guardian?: No  Alcohol/Substance Abuse:   What has been your use of drugs/alcohol within the last 12 months?: None reported If attempted suicide, did drugs/alcohol play a role in this?: No Alcohol/Substance Abuse Treatment Hx: Denies past history If yes, describe treatment: None reported Has alcohol/substance abuse ever caused legal problems?: No  Social Support System:   Patient's Community Support System: Fair Describe Community Support System:  mother Type of faith/religion: Christian  Leisure/Recreation:   Do You Have  Hobbies?: No  Strengths/Needs:   What is the patient's perception of their strengths?:  I am a good communicator Patient states these barriers may affect/interfere with their treatment: None reported Patient states these barriers may affect their return to the community: None reported Other important information patient would like considered in planning for their treatment: None reported  Discharge Plan:   Currently receiving community mental health services: No Patient states concerns and preferences for aftercare planning are: None reported Patient states they will know when they are safe and ready for discharge when:  I am ready now Does patient have access to transportation?: Yes Does patient have financial barriers related to discharge medications?: No Patient description of barriers related to discharge medications:  my mother help with to get if I need any medicines Will patient be returning to same living situation after discharge?: Yes  Summary/Recommendations:   Summary and Recommendations (to be completed by the evaluator): [name] is a 33 yo male involuntarily admitted to [facility] after presenting to [facility] due to taking an intentional overdose of an few hydrochlorothiazide and a couple naproxen. Pt reported he was dating a girl for the past 7-8 months and she moved unexpectedly to [place], [place] and it was too much for him to handle.  Pt reported stressors as employment issues and physical health where he is dealing with high blood pressure. Pt denies SI/HI/AVH. Pt reported no usage of substances. Pt reported no outpatient providers but requesting therapy services upon discharge from hospital. Patient will benefit from crisis stabilization, medication evaluation, group therapy and psychoeducation, in addition to case management for discharge planning. At discharge it is recommended that Patient adhere to the established discharge plan and continue in treatment.  Adrian Mcpherson R.  02/29/2024

## 2024-02-29 NOTE — [facility] Group Notes (Signed)
 Spirituality group facilitated by Elia Rockie Sofia, BCC.  Group Description: Group focused on topic of hope. Patients participated in facilitated discussion around topic, connecting with one another around experiences and definitions for hope. Group members engaged with visual explorer photos, reflecting on what hope looks like for them today. Group engaged in discussion around how their definitions of hope are present today in hospital.  Modalities: Psycho-social ed, Adlerian, Narrative, MI  Patient Progress: Adrian Mcpherson attended group and actively engaged and participated in group conversation and activities.  His comments demonstrated good insight and contributed positively to the group conversation.  He was very supportive of peers.

## 2024-02-29 NOTE — Plan of Care (Signed)
   Problem: Education: Goal: Knowledge of [facility] Education information/materials will improve Outcome: Progressing Goal: Emotional status will improve Outcome: Progressing Goal: Mental status will improve Outcome: Progressing Goal: Verbalization of understanding the information provided will improve Outcome: Progressing

## 2024-02-29 NOTE — Group Note (Signed)
 Recreation Therapy Group Note   Group Topic:Leisure Education  Group Date: 02/29/2024 Start Time: 0930 End Time: 1026 Facilitators: Bairon Klemann-McCall, LRT,CTRS Location: [address] Dayroom   Group Topic: Leisure Education  Goal Area(s) Addresses:  Patient will identify positive leisure and recreation activities.  Patient will identify one positive benefit of participation in leisure activities.   Behavioral Response: Engaged  Intervention: Group Game  Activity: Guess the Lyric. Patients were to spin the spinner and whatever category [employer], Pop, Hip Hop, R&B, Dance and Indie) the spinner landed on, the patient got a card from that deck. LRT read the lyric and the patient got 3 chances to guess the missing lyric. If they are unable to guess the lyric, the other players got the chance to guess the answer. If the spinner landed on the black triangle, the could steal a card from another player. The person with the most cards was the winner.  Education:  Teacher, English as a foreign language, Special educational needs teacher, Building control surveyor  Education Outcome: Acknowledges education/In group clarification offered/Needs additional education   Affect/Mood: Appropriate   Participation Level: Engaged   Participation Quality: Independent   Behavior: Appropriate   Speech/Thought Process: Focused   Insight: Good   Judgement: Good   Modes of Intervention: Competitive Play   Patient Response to Interventions:  Engaged   Education Outcome:  In group clarification offered    Clinical Observations/Individualized Feedback: Pt was engaged and bright during group. Pt was social and interactive with peers and appeared to have a good time with the activity. Pt was attentive and social throughout group session.     Plan: Continue to engage patient in RT group sessions 2-3x/week.   Finas Delone-McCall, LRT,CTRS 02/29/2024 1:25 PM

## 2024-02-29 NOTE — Plan of Care (Signed)
   Problem: Education: Goal: Emotional status will improve Outcome: Progressing   Problem: Activity: Goal: Interest or engagement in activities will improve Outcome: Progressing

## 2024-02-29 NOTE — Group Note (Signed)
 Date:  02/29/2024 Time:  9:00 PM  Group Topic/Focus:  Wrap-Up Group:   The focus of this group is to help patients review their daily goal of treatment and discuss progress on daily workbooks. Narcotics Anonymous (NA) Meeting    Participation Level:  Active  Participation Quality:  Appropriate  Affect:  Appropriate  Cognitive:  Appropriate  Insight: Appropriate  Engagement in Group:  Engaged  Modes of Intervention:  Discussion and Support  Additional Comments:  Patient attended NA  Eward Mace 02/29/2024, 9:00 PM

## 2024-02-29 NOTE — [facility] Suicide Risk Assessment (Signed)
 Suicide Risk Assessment  Admission Assessment    [facility] Admission Suicide Risk Assessment   Nursing information obtained from:  Patient  Demographic factors:  Male, Living alone, Unemployed, Low socioeconomic status  Current Mental Status:  Suicidal ideation indicated by patient  Loss Factors:  Loss of significant relationship  Historical Factors:  Impulsivity  Risk Reduction Factors:  Positive social support, Responsible for children under 47 years of age, Living with another person, especially a relative (Lives with mom)  Total Time spent with patient: 1.5 hours including the H&P.  Principal Problem: MDD (major depressive disorder)  Diagnosis:  Principal Problem:   MDD (major depressive disorder)  Subjective Data: See H&P.  Continued Clinical Symptoms:  Alcohol Use Disorder Identification Test Final Score (AUDIT): 0 The Alcohol Use Disorders Identification Test, Guidelines for Use in Primary Care, Second Edition.  World Science writer [facility]). Score between 0-7:  no or low risk or alcohol related problems. Score between 8-15:  moderate risk of alcohol related problems. Score between 16-19:  high risk of alcohol related problems. Score 20 or above:  warrants further diagnostic evaluation for alcohol dependence and treatment.  CLINICAL FACTORS:   Panic Attacks Alcohol/Substance Abuse/Dependencies  Musculoskeletal: Strength & Muscle Tone: within normal limits Gait & Station: normal Patient leans: N/A  Psychiatric Specialty Exam:  Presentation  General Appearance: Appropriate for Environment; Casual; Fairly Groomed  Eye Contact:Good  Speech:Clear and Coherent; Normal Rate  Speech Volume:Normal  Handedness:Right   Mood and Affect  Mood:-- (Patient currently denies any symptoms of depression or anxiety.)  Affect:Appropriate; Congruent   Thought Process  Thought Processes:Coherent; Linear  Descriptions of Associations:Intact  Orientation:Full (Time,  Place and Person)  Thought Content:Logical  History of Schizophrenia/Schizoaffective disorder: NA  Duration of Psychotic Symptoms: NA  Hallucinations:Hallucinations: None  Ideas of Reference:None  Suicidal Thoughts:Suicidal Thoughts: No  Homicidal Thoughts:Homicidal Thoughts: No   Sensorium  Memory:Immediate Good; Recent Good; Remote Good  Judgment:Poor  Insight:Fair   Executive Functions  Concentration:Good  Attention Span:Good  Recall:Good  Fund of Knowledge:Fair  Language:Good   Psychomotor Activity  Psychomotor Activity:Psychomotor Activity: Normal  Assets  Assets:Communication Skills; Desire for Improvement; Financial Resources/Insurance; Housing; Physical Health; Resilience; Social Support  Sleep  Sleep:Sleep: Good Number of Hours of Sleep: 8  Physical/Ros Exam:  Blood pressure 126/79, pulse (!) 49, temperature 98 F (36.7 C), temperature source Oral, resp. rate 18, height 6' (1.829 m), weight 101.2 kg, SpO2 100%. Body mass index is 30.24 kg/m.  COGNITIVE FEATURES THAT CONTRIBUTE TO RISK:  Polarized thinking and Thought constriction (tunnel vision)    SUICIDE RISK:   Moderate:  Frequent suicidal ideation with limited intensity, and duration, some specificity in terms of plans, no associated intent, good self-control, limited dysphoria/symptomatology, some risk factors present, and identifiable protective factors, including available and accessible social support.  PLAN OF CARE: See H&P.  I certify that inpatient services furnished can reasonably be expected to improve the patient's condition.   Mac Bolster, NP, pmhnp, fnp-bc. 02/29/2024, 2:13 PM

## 2024-02-29 NOTE — Group Note (Signed)
 Date:  02/29/2024 Time:  12:12 PM  Group Topic/Focus:  Goals Group:   The focus of this group is to help patients establish daily goals to achieve during treatment and discuss how the patient can incorporate goal setting into their daily lives to aide in recovery. Orientation:   The focus of this group is to educate the patient on the purpose and policies of crisis stabilization and provide a format to answer questions about their admission.  The group details unit policies and expectations of patients while admitted.    Participation Level:  Active  Participation Quality:  Appropriate  Affect:  Appropriate  Cognitive:  Alert, Appropriate, and Oriented  Insight: Appropriate  Engagement in Group:  Engaged  Modes of Intervention:  Discussion  Additional Comments: Pt states his goal is to uplift others.   Hikeem Andersson A Shelbia Scinto 02/29/2024, 12:12 PM

## 2024-02-29 NOTE — H&P (Signed)
 Psychiatric Admission Assessment Adult  Patient Identification: Adrian Mcpherson  MRN:  [id number]  Date of Evaluation:  02/29/2024  Chief Complaint: Suicide attempt by overdose on 8 tablets of Aleve & Tylenol .  Principal Diagnosis: MDD (major depressive disorder)  Diagnosis:  Principal Problem:   MDD (major depressive disorder)  History of Present Illness: This is the first psychiatric admission for this [facility] for this 33 year old AA male with no known hx of mental health issues or treatments. However, he does smoke cannabis. Patient was taken to the [facility] hospital ED by the [facility] police department with complaint of suicide attempt by overdose 8 tablets of Aleve/Tylenol . Apparently, this was triggered by a relationship break-up. After medical evaluation/stabilization at the [facility], patient was transferred to the [facility] for further psychiatric evaluation/treatments. Patient current lab results is reviewed. His UDS was positive for THC. During this evaluation, Adrian Mcpherson reports,   The police took me to the [facility] ED two days ago. My girlfriend called them. I had unexpected relationship break-up on that day. I coach basketball for youths. On that very day this happened, I had just finished coaching, I tried to go & surprise my girlfriend at her apartment. When I got there, she was not there. She was out & about running errands. I called her to see where she was. I told her that I came to see her. That was when she told me that she will be moving to [place] the next day. I was shocked. I asked how are we were going to maintain a long distance relationship? She relied, we are not. She literarily broke up with me. That was when I panicked, then took the 8 tablets of Aleve & 8 tablets of tylenol . Then, I texted her & told her what I had done. She called 911 for me & the cops took me to the hospital. This was an impulsive behavior. I did not think it through. I was not  trying to kill myself, rather I was trying to get her attention. She did come to visit me at the hospital, but I know now that she is not changing her mind. I have no diagnosis of any mental illnesses. I have never been in a psychiatric hospitals or been treated for one. I have to tell you, I was not thinking about hurting myself. I'm not depressed. My mother called me, was shocked & mad at me the same time. This is the first time I have ever done a thing like this or will ever do a thing like this again. I have no anxiety symptoms & I sleep well at night.   Adrian Mcpherson currently denies any SIHI, AVH, delusional thoughts or paranoia. He does not appear to be responding to any internal stimuli. He presents with a good affect, good eye contact & verbally responsive. He currently denies any mental health illnesses or suicides in his family. Adrian Mcpherson currently declines any medications for mental health issues. Denies any medical medical issues or conditions.  Associated Signs/Symptoms:  Depression Symptoms:  Patient currently denies any symptoms of depression or anxiety.  (Hypo) Manic Symptoms:  Impulsivity,  Anxiety Symptoms:  Excessive Worry,  Psychotic Symptoms:  Patient denies any AVH, delusional thoughts or paranoia.  PTSD Symptoms: NA  Total Time spent with patient: 1.5 hours  Past Psychiatric History: Patient denies any previous hx of mental health issues or treatments.   Is the patient at risk to self? No.  Has the patient been  a risk to self in the past 6 months? No.  Has the patient been a risk to self within the distant past? No.  Is the patient a risk to others? No.  Has the patient been a risk to others in the past 6 months? No.  Has the patient been a risk to others within the distant past? No.   [place] Scale:  Flowsheet Row Ad[facility] (Current) [facility] from [facility]27/2024 in [facility] Department at [facility] ED from 10/15/2022 in [facility] Emergency Department at [facility]  C-SSRS RISK CATEGORY High Risk High Risk No Risk   Prior Inpatient Therapy: No. If yes, describe: NA   Prior Outpatient Therapy: No. If yes, describe: NA   Alcohol Screening: Patient refused Alcohol Screening Tool: Yes 1. How often do you have a drink containing alcohol?: Never 2. How many drinks containing alcohol do you have on a typical day when you are drinking?: 1 or 2 3. How often do you have six or more drinks on one occasion?: Never AUDIT-C Score: 0 4. How often during the last year have you found that you were not able to stop drinking once you had started?: Never 5. How often during the last year have you failed to do what was normally expected from you because of drinking?: Never 6. How often during the last year have you needed a first drink in the morning to get yourself going after a heavy drinking session?: Never 7. How often during the last year have you had a feeling of guilt of remorse after drinking?: Never 8. How often during the last year have you been unable to remember what happened the night before because you had been drinking?: Never 9. Have you or someone else been injured as a result of your drinking?: No 10. Has a relative or friend or a doctor or another health worker been concerned about your drinking or suggested you cut down?: No Alcohol Use Disorder Identification Test Final Score (AUDIT): 0 Alcohol Brief Interventions/Follow-up: Alcohol education/Brief advice  Substance Abuse History in the last 12 months:  Yes.    Consequences of Substance Abuse: Discussed with patient during this admission evaluation. Medical Consequences:  Liver damage, Possible death by overdose Legal Consequences:  Arrests, jail time, Loss of driving privilege. Family Consequences:  Family discord, divorce and or separation.  Previous Psychotropic Medications: No   Psychological Evaluations: Yes    Past Medical History: History reviewed. No pertinent past medical history. History reviewed. No pertinent surgical history.  Family History:  Family History  Problem Relation Age of Onset   Healthy Mother    Healthy Father    Family Psychiatric  History: Patient denies any familial hx of mental illnesses or conditions.  Tobacco Screening:  Social History   Tobacco Use  Smoking Status Never  Smokeless Tobacco Never    BH Tobacco Counseling     Are you interested in Tobacco Cessation Medications?  No value filed. Counseled patient on smoking cessation:  No value filed. Reason Tobacco Screening Not Completed: No value filed.       Social History: Single, has 3 children. Lives in [place], [place]. Employed. Social History   Substance and Sexual Activity  Alcohol Use Never     Social History   Substance and Sexual Activity  Drug Use Never    Additional Social History:  Allergies:   Allergies  Allergen Reactions   Mangifera Indica Fruit  Ext (Mango) [Mangifera Indica] Hives and Swelling   Pineapple Hives and Swelling   Tomato Hives and Swelling   Lab Results:  Results for orders placed or performed during the hospital encounter of 02/27/24 (from the past 48 hours)  Rapid urine drug screen (hospital performed)     Status: Abnormal   Collection Time: 02/27/24  7:14 PM  Result Value Ref Range   Opiates NONE DETECTED NONE DETECTED   Cocaine NONE DETECTED NONE DETECTED   Benzodiazepines NONE DETECTED NONE DETECTED   Amphetamines NONE DETECTED NONE DETECTED   Tetrahydrocannabinol POSITIVE (A) NONE DETECTED   Barbiturates NONE DETECTED NONE DETECTED    Comment: (NOTE) DRUG SCREEN FOR MEDICAL PURPOSES ONLY.  IF CONFIRMATION IS NEEDED FOR ANY PURPOSE, NOTIFY LAB WITHIN 5 DAYS.  LOWEST DETECTABLE LIMITS FOR URINE DRUG SCREEN Drug Class                     Cutoff (ng/mL) Amphetamine and metabolites    1000 Barbiturate and metabolites    200 Benzodiazepine                  200 Opiates and metabolites        300 Cocaine and metabolites        300 THC                            50 Performed at [facility] Lab, [address], [place], [place] [address]   Comprehensive metabolic panel     Status: Abnormal   Collection Time: 02/27/24  9:15 PM  Result Value Ref Range   Sodium 141 135 - 145 mmol/L   Potassium 4.1 3.5 - 5.1 mmol/L   Chloride 106 98 - 111 mmol/L   CO2 25 22 - 32 mmol/L   Glucose, Bld 92 70 - 99 mg/dL    Comment: Glucose reference range applies only to samples taken after fasting for at least 8 hours.   BUN 12 6 - 20 mg/dL   Creatinine, Ser 8.36 (H) 0.61 - 1.24 mg/dL   Calcium 9.3 8.9 - 89.6 mg/dL   Total Protein 6.8 6.5 - 8.1 g/dL   Albumin 4.3 3.5 - 5.0 g/dL   AST 22 15 - 41 U/L   ALT 17 0 - 44 U/L   Alkaline Phosphatase 46 38 - 126 U/L   Total Bilirubin 1.1 0.0 - 1.2 mg/dL   GFR, Estimated 56 (L) >60 mL/min    Comment: (NOTE) Calculated using the CKD-EPI Creatinine Equation (2021)    Anion gap 10 5 - 15    Comment: Performed at [facility] Lab, [address], [place], [place] [address]  Ethanol     Status: None   Collection Time: 02/27/24  9:15 PM  Result Value Ref Range   Alcohol, Ethyl (B) <15 <15 mg/dL    Comment: (NOTE) For medical purposes only. Performed at [facility], [address], [place], [place] [address]   cbc     Status: None   Collection Time: 02/27/24  9:15 PM  Result Value Ref Range   WBC 6.1 4.0 - 10.5 K/uL   RBC 4.78 4.22 - 5.81 MIL/uL   Hemoglobin 13.7 13.0 - 17.0 g/dL   HCT 57.6 60.9 - 47.9 %   MCV 88.5 80.0 - 100.0 fL   MCH 28.7 26.0 - 34.0 pg   MCHC 32.4 30.0 - 36.0 g/dL  RDW 13.0 11.5 - 15.5 %   Platelets 175 150 - 400 K/uL   nRBC 0.0 0.0 - 0.2 %    Comment: Performed at [facility], [address] [place], [place] [address]  Salicylate level     Status: Abnormal   Collection Time: 02/27/24  9:15 PM  Result Value Ref Range   Salicylate Lvl <7.0 (L) 7.0 - 30.0 mg/dL     Comment: Performed at [facility] Lab, [address], [place], [place] [address]  Acetaminophen  level     Status: Abnormal   Collection Time: 02/27/24  9:15 PM  Result Value Ref Range   Acetaminophen  (Tylenol ), Serum <10 (L) 10 - 30 ug/mL    Comment: (NOTE) Therapeutic concentrations vary significantly. A range of 10-30 ug/mL  may be an effective concentration for many patients. However, some  are best treated at concentrations outside of this range. Acetaminophen  concentrations >150 ug/mL at 4 hours after ingestion  and >50 ug/mL at 12 hours after ingestion are often associated with  toxic reactions.  Performed at [facility] Lab, [address] [place], [place] [address]   Acetaminophen  level     Status: Abnormal   Collection Time: 02/27/24 11:33 PM  Result Value Ref Range   Acetaminophen  (Tylenol ), Serum <10 (L) 10 - 30 ug/mL    Comment: (NOTE) Therapeutic concentrations vary significantly. A range of 10-30 ug/mL  may be an effective concentration for many patients. However, some  are best treated at concentrations outside of this range. Acetaminophen  concentrations >150 ug/mL at 4 hours after ingestion  and >50 ug/mL at 12 hours after ingestion are often associated with  toxic reactions.  Performed at [facility] Lab, [address] [place], [place] [address]     Blood Alcohol level:  Lab Results  Component Value Date   [facility] <15 02/27/2024    Metabolic Disorder Labs:  No results found for: HGBA1C, MPG No results found for: PROLACTIN No results found for: CHOL, TRIG, HDL, CHOLHDL, VLDL, LDLCALC  Current Medications: Current Facility-Administered Medications  Medication Dose Route Frequency Provider Last Rate Last Admin   acetaminophen  (TYLENOL ) tablet 650 mg  650 mg Oral Q6H PRN Mardy Legacy, NP       alum & mag hydroxide-simeth (MAALOX/MYLANTA) 200-200-20 MG/5ML suspension 30 mL  30 mL Oral Q4H PRN Mardy Legacy, NP        amLODipine (NORVASC) tablet 5 mg  5 mg Oral Daily Mardy Legacy, NP   5 mg at 02/29/24 0801   haloperidol (HALDOL) tablet 5 mg  5 mg Oral TID PRN Mardy Legacy, NP       And   diphenhydrAMINE (BENADRYL) capsule 50 mg  50 mg Oral TID PRN Mardy Legacy, NP       haloperidol lactate (HALDOL) injection 5 mg  5 mg Intramuscular TID PRN Mardy Legacy, NP       And   diphenhydrAMINE (BENADRYL) injection 50 mg  50 mg Intramuscular TID PRN Mardy Legacy, NP       And   LORazepam (ATIVAN) injection 2 mg  2 mg Intramuscular TID PRN Mardy Legacy, NP       haloperidol lactate (HALDOL) injection 10 mg  10 mg Intramuscular TID PRN Mardy Legacy, NP       And   diphenhydrAMINE (BENADRYL) injection 50 mg  50 mg Intramuscular TID PRN Mardy Legacy, NP       And   LORazepam (ATIVAN) injection 2 mg  2  mg Intramuscular TID PRN Mardy Legacy, NP       hydrOXYzine (ATARAX) tablet 25 mg  25 mg Oral TID PRN Mardy Legacy, NP       magnesium hydroxide (MILK OF MAGNESIA) suspension 30 mL  30 mL Oral Daily PRN Mardy Legacy, NP       traZODone (DESYREL) tablet 50 mg  50 mg Oral QHS PRN Mardy Legacy, NP       PTA Medications: Medications Prior to Admission  Medication Sig Dispense Refill Last Dose/Taking   amLODipine (NORVASC) 5 MG tablet Take 1 tablet (5 mg total) by mouth daily. 30 tablet 0    AIMS:  ,  ,  ,  ,  ,  ,    Musculoskeletal: Strength & Muscle Tone: within normal limits Gait & Station: normal Patient leans: N/A  Psychiatric Specialty Exam:  Presentation  General Appearance: Appropriate for Environment; Casual; Fairly Groomed  Eye Contact:Good  Speech:Clear and Coherent; Normal Rate  Speech Volume:Normal  Handedness:Right  Mood and Affect  Mood:-- (Patient currently denies any symptoms of depression or anxiety.)  Affect:Appropriate; Congruent  Thought Process  Thought Processes:Coherent; Linear  Duration of Psychotic Symptoms:N/A  Past  Diagnosis of Schizophrenia or Psychoactive disorder: No data recorded  Descriptions of Associations:Intact  Orientation:Full (Time, Place and Person)  Thought Content:Logical  Hallucinations:Hallucinations: None  Ideas of Reference:None  Suicidal Thoughts:Suicidal Thoughts: No  Homicidal Thoughts:Homicidal Thoughts: No  Sensorium  Memory:Immediate Good; Recent Good; Remote Good  Judgment:Poor  Insight:Fair  Executive Functions  Concentration:Good  Attention Span:Good  Recall:Good  Fund of Knowledge:Fair  Language:Good  Psychomotor Activity  Psychomotor Activity:Psychomotor Activity: Normal   Assets  Assets:Communication Skills; Desire for Improvement; Financial Resources/Insurance; Housing; Physical Health; Resilience; Social Support  Sleep  Sleep:Sleep: Good  Estimated Sleeping Duration (Last 24 Hours): 7.50-7.75 hours  Physical Exam: Physical Exam Vitals and nursing note reviewed.  HENT:     Head: Normocephalic.     Nose: Nose normal.     Mouth/Throat:     Pharynx: Oropharynx is clear.  Pulmonary:     Effort: Pulmonary effort is normal.  Genitourinary:    Comments: Deferred Musculoskeletal:        General: Normal range of motion.     Cervical back: Normal range of motion.  Skin:    General: Skin is dry.  Neurological:     General: No focal deficit present.     Mental Status: He is alert and oriented to person, place, and time.    Review of Systems  Constitutional:  Negative for chills, diaphoresis and fever.  HENT:  Negative for congestion and sore throat.   Eyes:  Negative for blurred vision.  Respiratory:  Negative for cough, shortness of breath and wheezing.   Cardiovascular:  Negative for chest pain and palpitations.  Gastrointestinal:  Negative for abdominal pain, constipation, diarrhea, heartburn, nausea and vomiting.  Genitourinary:  Negative for dysuria.  Musculoskeletal:  Negative for joint pain and myalgias.  Skin:  Negative  for itching and rash.  Neurological:  Negative for dizziness, tingling, tremors, sensory change, speech change, focal weakness, seizures, loss of consciousness, weakness and headaches.  Endo/Heme/Allergies:        Allergies: NKDA.   Other: Mango, Pineapple, Tomato.   Blood pressure 126/79, pulse (!) 49, temperature 98 F (36.7 C), temperature source Oral, resp. rate 18, height 6' (1.829 m), weight 101.2 kg, SpO2 100%. Body mass index is 30.24 kg/m.  Treatment Plan Summary: Daily contact with patient to assess and evaluate  symptoms and progress in treatment and Medication management.   Principal/active diagnoses.  MDD (major depressive disorder)  Plan: The risks/benefits/side-effects/alternatives to the medications in use were discussed in detail with the patient and time was given for patient's questions. The patient consents to medication trial.   Patient declines to be on any medications for depression.  Current meds.  -Continue amlodipine 5 mg po for HTN.   Agitation protocols:  -Continue as already in progress.  Other PRNS -Continue Tylenol  650 mg every 6 hours PRN for mild pain. -Continue Maalox 30 ml Q 4 hrs PRN for indigestion. -Continue MOM 30 ml po Q 6 hrs for constipation.  -Continue Trazodone 50 mg po Q hs prn for insomnia.  -Continue Hydroxyzine 25 mg po tid prn for anxiety.  Safety and Monitoring: Voluntary admission to inpatient psychiatric unit for safety, stabilization and treatment Daily contact with patient to assess and evaluate symptoms and progress in treatment Patient's case to be discussed in multi-disciplinary team meeting Observation Level : q15 minute checks Vital signs: q12 hours Precautions: Safety  Discharge Planning: Social work and case management to assist with discharge planning and identification of hospital follow-up needs prior to discharge Estimated LOS: 5-7 days Discharge Concerns: Need to establish a safety plan; Medication  compliance and effectiveness Discharge Goals: Return home with outpatient referrals for mental health follow-up including medication management/psychotherapy  Observation Level/Precautions:  15 minute checks  Laboratory:  Per Ed. Current lab results reviewed. Will obtain TSH, lipid panel & Hgba1c.  Psychotherapy:  Enrolled in the group sessions.   Medications: See MAR.  Consultations: As needed.  Discharge Concerns: Safety, mood stability.  Estimated LOS: 3-5 days.  Other: NA   Physician Treatment Plan for Primary Diagnosis: MDD (major depressive disorder)  Long Term Goal(s): Improvement in symptoms so as ready for discharge  Short Term Goals: Ability to identify changes in lifestyle to reduce recurrence of condition will improve, Ability to verbalize feelings will improve, Ability to disclose and discuss suicidal ideas, and Ability to demonstrate self-control will improve  Physician Treatment Plan for Secondary Diagnosis: Principal Problem:   MDD (major depressive disorder)  Long Term Goal(s): Improvement in symptoms so as ready for discharge  Short Term Goals: Ability to identify and develop effective coping behaviors will improve, Ability to maintain clinical measurements within normal limits will improve, Compliance with prescribed medications will improve, and Ability to identify triggers associated with substance abuse/mental health issues will improve  I certify that inpatient services furnished can reasonably be expected to improve the patient's condition.    Mac Bolster, NP, pmhnp, fnp-bc. 7/2/20252:17 PM
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
## 2024-03-01 DIAGNOSIS — F320 Major depressive disorder, single episode, mild: Secondary | ICD-10-CM | POA: Diagnosis not present

## 2024-03-01 NOTE — Progress Notes (Signed)
 [facility] MD Progress Note  03/01/2024 10:10 AM Adrian Mcpherson  MRN:  [id number]  Reason for admission: 33 year old AA male with no known hx of mental health issues or treatments. However, he does smoke cannabis. Patient was taken to the [facility] hospital ED by the [facility] police department with complaint of suicide attempt by overdose 8 tablets of Aleve/Tylenol . Apparently, this was triggered by a relationship break-up. After medical evaluation/stabilization at the [facility], patient was transferred to the [facility] for further psychiatric evaluation/treatments. Patient current lab results is reviewed. His UDS was positive for THC.   Daily notes: Adrian Mcpherson is seen, chart reviewed. The chart findings discussed with the treatment team during the team meeting this afternoon.  He presents alert, oriented & aware of situation. He is visible on the unit, attending group sessions. He presents with an improving affect, good eye contact & verbally responsive. He reports, I'm doing great. I slept well last night. I have no symptoms of depression or anxiety. My appetite is good. I have not used any medicines for sleep or anxiety because I'm okay. The group has been helpful for me too. Adrian Mcpherson currently denies any SIHI, AVH, delusional thoughts or paranoia. He does not appear to be responding to any internal stimuli. Patient since his admission, is doing well. He expressed that his actions that led him to be hospitalized was rather impulsive because it was unexpected & unplanned. He continues to deny any mental health issues or concerns. He denies any SIHI, AVH, delusional thoughts or paranoia. He does not appear to be responding to any internal stimuli. There are no behavioral issues reported by staff. His vital signs remain stable.  Collateral information obtained & provided by patient's mother Adrian Mcpherson [phone]. She reports, Adrian Mcpherson is my son. He lives with me & my husband. I have not seen him  depressed, however, he is in between jobs at this time which could be a little tiresome. He did meet a girl that he liked very much & has been going out with her. But she lost the place of her resident (got put out) & she decided to move back to [place] where she has a family. Yolanda took the news very hard & instead of talking to someone about how he was feeling, he expressed it the wrong way. No, I'm not concerned about his well being. He lives with us  & he is safe in our home. I have decided to get him some help so he can have someone to talk to when he needs to clear his mind. I'm ready for him to come home.  Principal Problem: MDD (major depressive disorder)  Diagnosis: Principal Problem:   MDD (major depressive disorder)  Total Time spent with patient: 45 minutes  Past Psychiatric History: See H&P  Past Medical History: History reviewed. No pertinent past medical history. History reviewed. No pertinent surgical history.  Family History:  Family History  Problem Relation Age of Onset   Healthy Mother    Healthy Father    Family Psychiatric  History: See H&P.  Social History:  Social History   Substance and Sexual Activity  Alcohol Use Never     Social History   Substance and Sexual Activity  Drug Use Never    Social History   Socioeconomic History   Marital status: Single    Spouse name: Not on file   Number of children: Not on file   Years of education: Not on file   Highest education  level: Not on file  Occupational History   Not on file  Tobacco Use   Smoking status: Never   Smokeless tobacco: Never  Vaping Use   Vaping status: Never Used  Substance and Sexual Activity   Alcohol use: Never   Drug use: Never   Sexual activity: Not on file  Other Topics Concern   Not on file  Social History Narrative   Not on file   Social Drivers of Health   Financial Resource Strain: Not on file  Food Insecurity: No Food Insecurity (02/28/2024)   Hunger Vital  Sign    Worried About Running Out of Food in the Last Year: Never true    Ran Out of Food in the Last Year: Never true  Transportation Needs: No Transportation Needs (02/28/2024)   PRAPARE - Administrator, Civil Service (Medical): No    Lack of Transportation (Non-Medical): No  Physical Activity: Not on file  Stress: Not on file  Social Connections: Not on file   Additional Social History:   Sleep: Good Estimated Sleeping Duration (Last 24 Hours): 5.00 hours  Appetite:  Good  Current Medications: Current Facility-Administered Medications  Medication Dose Route Frequency Provider Last Rate Last Admin   acetaminophen  (TYLENOL ) tablet 650 mg  650 mg Oral Q6H PRN Mardy Legacy, NP       alum & mag hydroxide-simeth (MAALOX/MYLANTA) 200-200-20 MG/5ML suspension 30 mL  30 mL Oral Q4H PRN Mardy Legacy, NP       amLODipine (NORVASC) tablet 5 mg  5 mg Oral Daily Mardy Legacy, NP   5 mg at 03/01/24 0910   haloperidol (HALDOL) tablet 5 mg  5 mg Oral TID PRN Mardy Legacy, NP       And   diphenhydrAMINE (BENADRYL) capsule 50 mg  50 mg Oral TID PRN Mardy Legacy, NP       haloperidol lactate (HALDOL) injection 5 mg  5 mg Intramuscular TID PRN Mardy Legacy, NP       And   diphenhydrAMINE (BENADRYL) injection 50 mg  50 mg Intramuscular TID PRN Mardy Legacy, NP       And   LORazepam (ATIVAN) injection 2 mg  2 mg Intramuscular TID PRN Mardy Legacy, NP       haloperidol lactate (HALDOL) injection 10 mg  10 mg Intramuscular TID PRN Mardy Legacy, NP       And   diphenhydrAMINE (BENADRYL) injection 50 mg  50 mg Intramuscular TID PRN Mardy Legacy, NP       And   LORazepam (ATIVAN) injection 2 mg  2 mg Intramuscular TID PRN Mardy Legacy, NP       hydrOXYzine (ATARAX) tablet 25 mg  25 mg Oral TID PRN Mardy Legacy, NP       magnesium hydroxide (MILK OF MAGNESIA) suspension 30 mL  30 mL Oral Daily PRN Mardy Legacy, NP       traZODone (DESYREL)  tablet 50 mg  50 mg Oral QHS PRN Mardy Legacy, NP       Lab Results: No results found for this or any previous visit (from the past 48 hours).  Blood Alcohol level:  Lab Results  Component Value Date   [facility] <15 02/27/2024   Metabolic Disorder Labs: No results found for: HGBA1C, MPG No results found for: PROLACTIN No results found for: CHOL, TRIG, HDL, CHOLHDL, VLDL, LDLCALC  Physical Findings: AIMS:  ,  ,  ,  ,  ,  ,   CIWA:  COWS:     Musculoskeletal: Strength & Muscle Tone: within normal limits Gait & Station: normal Patient leans: N/A  Psychiatric Specialty Exam:  Presentation  General Appearance:  Appropriate for Environment; Casual; Fairly Groomed  Eye Contact: Good  Speech: Clear and Coherent; Normal Rate  Speech Volume: Normal  Handedness: Right   Mood and Affect  Mood: -- (Patient currently denies any symptoms of depression or anxiety.)  Affect: Appropriate; Congruent   Thought Process  Thought Processes: Coherent; Linear  Descriptions of Associations:Intact  Orientation:Full (Time, Place and Person)  Thought Content:Logical  History of Schizophrenia/Schizoaffective disorder:No data recorded Duration of Psychotic Symptoms:No data recorded Hallucinations:Hallucinations: None  Ideas of Reference:None  Suicidal Thoughts:Suicidal Thoughts: No  Homicidal Thoughts:Homicidal Thoughts: No   Sensorium  Memory: Immediate Good; Recent Good; Remote Good  Judgment: Poor  Insight: Fair   Art therapist  Concentration: Good  Attention Span: Good  Recall: Good  Fund of Knowledge: Fair  Language: Good  Psychomotor Activity  Psychomotor Activity: Psychomotor Activity: Normal  Assets  Assets: Communication Skills; Desire for Improvement; Financial Resources/Insurance; Housing; Physical Health; Resilience; Social Support  Sleep  Sleep: Sleep: Good Number of Hours of Sleep: 8  Physical  Exam: Physical Exam Vitals and nursing note reviewed.  HENT:     Head: Normocephalic.     Nose: Nose normal.     Mouth/Throat:     Pharynx: Oropharynx is clear.  Cardiovascular:     Rate and Rhythm: Normal rate.     Pulses: Normal pulses.  Pulmonary:     Effort: Pulmonary effort is normal.  Genitourinary:    Comments: Deferred Musculoskeletal:        General: Normal range of motion.     Cervical back: Normal range of motion.  Skin:    General: Skin is dry.  Neurological:     General: No focal deficit present.     Mental Status: He is alert and oriented to person, place, and time.    Review of Systems  Constitutional:  Negative for chills, diaphoresis and fever.  HENT:  Negative for congestion and sore throat.   Eyes:  Negative for blurred vision.  Respiratory:  Negative for cough, shortness of breath and wheezing.   Cardiovascular:  Negative for chest pain and palpitations.  Gastrointestinal:  Negative for abdominal pain, constipation, diarrhea, heartburn, nausea and vomiting.  Genitourinary:  Negative for dysuria.  Musculoskeletal:  Negative for joint pain and myalgias.  Skin:  Negative for rash.  Neurological:  Negative for dizziness, tingling, tremors, sensory change, speech change, focal weakness, seizures, loss of consciousness, weakness and headaches.  Endo/Heme/Allergies:        See Allergy lists.   Psychiatric/Behavioral:  Positive for depression and substance abuse (Uses THC). Negative for hallucinations, memory loss and suicidal ideas. The patient is not nervous/anxious and does not have insomnia.    Blood pressure 136/89, pulse 76, temperature 98.2 F (36.8 C), temperature source Oral, resp. rate 18, height 6' (1.829 m), weight 101.2 kg, SpO2 100%. Body mass index is 30.24 kg/m.  Treatment Plan Summary: Daily contact with patient to assess and evaluate symptoms and progress in treatment and Medication management.   Principal/active diagnoses.  MDD (major  depressive disorder)  Plan: The risks/benefits/side-effects/alternatives to the medications in use were discussed in detail with the patient and time was given for patient's questions. The patient consents to medication trial.    Patient declines to be on any medications for depression.   Current meds.  -Continue amlodipine 5  mg po for HTN.    Agitation protocols:  -Continue as already in progress.   Other PRNS -Continue Tylenol  650 mg every 6 hours PRN for mild pain. -Continue Maalox 30 ml Q 4 hrs PRN for indigestion. -Continue MOM 30 ml po Q 6 hrs for constipation.  -Continue Trazodone 50 mg po Q hs prn for insomnia.  -Continue Hydroxyzine 25 mg po tid prn for anxiety.   Safety and Monitoring: Voluntary admission to inpatient psychiatric unit for safety, stabilization and treatment Daily contact with patient to assess and evaluate symptoms and progress in treatment Patient's case to be discussed in multi-disciplinary team meeting Observation Level : q15 minute checks Vital signs: q12 hours Precautions: Safety   Discharge Planning: Social work and case management to assist with discharge planning and identification of hospital follow-up needs prior to discharge Estimated LOS: 5-7 days Discharge Concerns: Need to establish a safety plan; Medication compliance and effectiveness Discharge Goals: Return home with outpatient referrals for mental health follow-up including medication management/psychotherapy  Mac Bolster, NP, PMHNP, FNP-bc 03/01/2024, 10:10 AM

## 2024-03-01 NOTE — [facility] Group Notes (Signed)
 [facility] Group Notes:  (Nursing/MHT/Case Management/Adjunct)  Date:  03/01/2024  Time:  0830am  Type of Therapy:  The focus of this group is to help patients establish daily goals to achieve during treatment and discuss how the patient can incorporate goal setting into their daily lives to aide in recover  Participation Level:  Active  Participation Quality:  Appropriate and Attentive  Affect:  Appropriate  Cognitive:  Alert and Appropriate  Insight:  Appropriate  Engagement in Group:  Engaged  Modes of Intervention:  Discussion  Summary of Progress/Problems: Pt state his goal were to become a positive impact   Adrian Mcpherson [name] 03/01/2024, 2:34 PM

## 2024-03-01 NOTE — Plan of Care (Signed)

## 2024-03-01 NOTE — Progress Notes (Incomplete)
 Patient is at his baseline.  No evidence of depression.  No overwhelming anxiety.  No evidence of mania.  No evidence of psychosis.  He has good support at home.  He is adjusting well to recent break-up.  We are finalizing aftercare.  Scheduled for discharge tomorrow if he maintains stability.

## 2024-03-01 NOTE — Group Note (Signed)
 Date:  03/01/2024 Time:  11:03 PM  Group Topic/Focus:  Wrap-Up Group:   The focus of this group is to help patients review their daily goal of treatment and discuss progress on daily workbooks.    Participation Level:  Active  Participation Quality:  Appropriate  Affect:  Appropriate  Cognitive:  Appropriate  Insight: Appropriate  Engagement in Group:  Engaged  Modes of Intervention:  Support  Additional Comments:    Rosalind JONETTA Rattler 03/01/2024, 11:03 PM

## 2024-03-01 NOTE — Group Note (Signed)
 LCSW Group Therapy Note   Group Date: 03/01/2024 Start Time: 1100 End Time: 1200   Participation:  patient was present  Type of Therapy:  Group Therapy  Title:  From "One Day" to "Today is Day One": Begin Your Journey to Better Health and Mental Well-Being  Objective:  To educate participants on the importance of routine, sleep, diet, and movement for improving mental health and overall well-being. Encourage goal-setting for small, achievable changes.  3 Goals: Encourage participants to set one small, achievable goal related to sleep, diet, or movement for the coming week. Promote self-awareness by discussing the connection between physical and mental health. Support accountability by having participants share goals with the group.  Summary: Participants engaged in a discussion about lifestyle factors influencing mental health, including routines, sleep, nutrition, and movement. They set personal goals for the week to improve well-being and shared their goals for accountability.  Therapeutic Modalities: Cognitive Behavioral Therapy (CBT) for exploring the relationship between thoughts, feelings, and behaviors. Psychoeducation on lifestyle changes and their impact on mental health. Goal-setting to promote empowerment and motivation.   Bria Portales O Oleva Koo, LCSWA 03/01/2024  8:20 PM

## 2024-03-01 NOTE — Progress Notes (Signed)
   03/01/24 0841  Psych Admission Type (Psych Patients Only)  Admission Status Involuntary  Psychosocial Assessment  Patient Complaints Anxiety  Eye Contact Fair  Facial Expression Other (Comment) (WNL)  Affect Appropriate to circumstance  Speech Logical/coherent  Interaction Assertive  Motor Activity  (WNL)  Appearance/Hygiene In scrubs  Behavior Characteristics Cooperative;Appropriate to situation  Mood Pleasant  Thought Process  Coherency WDL  Content WDL  Delusions None reported or observed  Perception WDL  Hallucination None reported or observed  Judgment WDL  Confusion None  Danger to Self  Current suicidal ideation? Denies  Agreement Not to Harm Self Yes  Description of Agreement verbal  Danger to Others  Danger to Others None reported or observed

## 2024-03-01 NOTE — Group Note (Unsigned)
 Date:  03/01/2024 Time:  11:20 AM  Group Topic/Focus:  Goals Group:   The focus of this group is to help patients establish daily goals to achieve during treatment and discuss how the patient can incorporate goal setting into their daily lives to aide in recovery.     Participation Level:  {BHH PARTICIPATION OZCZO:77735}  Participation Quality:  {B[facility] PARTICIPATION QUALITY:22265}  Affect:  {BHH AFFECT:22266}  Cognitive:  {B[facility] COGNITIVE:22267}  Insight: {BHH Insight2:20797}  Engagement in Group:  {B[facility] ENGAGEMENT IN HMNLE:77731}  Modes of Intervention:  {BHH MODES OF INTERVENTION:22269}  Additional Comments:  ***  Adrian Mcpherson 03/01/2024, 11:20 AM

## 2024-03-02 DIAGNOSIS — F320 Major depressive disorder, single episode, mild: Secondary | ICD-10-CM | POA: Diagnosis not present

## 2024-03-02 MED ORDER — AMLODIPINE BESYLATE 5 MG PO TABS: 5.0000 mg | ORAL_TABLET | Freq: Every day | ORAL | 0 refills | Status: AC

## 2024-03-02 MED ORDER — CLONIDINE HCL 0.1 MG PO TABS
0.1000 mg | ORAL_TABLET | Freq: Once | ORAL | Status: AC
  Administered 2024-03-02: 0.1 mg via ORAL
  Filled 2024-03-02: qty 1

## 2024-03-02 MED ORDER — AMLODIPINE BESYLATE 5 MG PO TABS: 5.0000 mg | ORAL_TABLET | Freq: Every day | ORAL | 0 refills | Status: DC

## 2024-03-02 NOTE — [facility] Suicide Risk Assessment (Signed)
 Suicide Risk Assessment  Discharge Assessment    [facility] Discharge Suicide Risk Assessment   Principal Problem: MDD (major depressive disorder)  Discharge Diagnoses: Principal Problem:   MDD (major depressive disorder)  Total Time spent with patient: Greater than 30 minutes  Musculoskeletal: Strength & Muscle Tone: within normal limits Gait & Station: normal Patient leans: N/A  Psychiatric Specialty Exam  Presentation  General Appearance:  Appropriate for Environment; Casual; Fairly Groomed  Eye Contact: Good  Speech: Clear and Coherent; Normal Rate  Speech Volume: Normal  Handedness: Right   Mood and Affect  Mood: Euthymic  Duration of Depression Symptoms: Greater than 2 days.  Affect: Appropriate; Congruent   Thought Process  Thought Processes: Coherent; Goal Directed  Descriptions of Associations:Intact  Orientation:Full (Time, Place and Person)  Thought Content:Logical  History of Schizophrenia/Schizoaffective disorder: NA  Duration of Psychotic Symptoms: NA  Hallucinations:Hallucinations: None  Ideas of Reference:None  Suicidal Thoughts:Suicidal Thoughts: No  Homicidal Thoughts:Homicidal Thoughts: No   Sensorium  Memory: Immediate Good; Recent Good; Remote Good  Judgment: Good  Insight: Good   Executive Functions  Concentration: Good  Attention Span: Good  Recall: Good  Fund of Knowledge: Good  Language: Good   Psychomotor Activity  Psychomotor Activity:Psychomotor Activity: Normal   Assets  Assets: Communication Skills; Desire for Improvement; Financial Resources/Insurance; Housing; Physical Health; Resilience; Social Support   Sleep  Sleep:Sleep: Good  Estimated Sleeping Duration (Last 24 Hours): 4.00-5.25 hours  Physical Exam: See the discharge summary.  Blood pressure (!) 136/90, pulse (!) 52, temperature 98.2 F (36.8 C), temperature source Oral, resp. rate 17, height 6' (1.829 m), weight 101.2  kg, SpO2 100%. Body mass index is 30.24 kg/m.  Mental Status Per Nursing Assessment::   On Admission:  Suicidal ideation indicated by patient  Demographic Factors:  Male and Adolescent or young adult  Loss Factors: Loss of significant relationship  Historical Factors: Impulsivity  Risk Reduction Factors:   Responsible for children under 42 years of age, Sense of responsibility to family, Religious beliefs about death, Living with another person, especially a relative, Positive social support, Positive therapeutic relationship, and Positive coping skills or problem solving skills  Continued Clinical Symptoms:  Alcohol/Substance Abuse/Dependencies  Cognitive Features That Contribute To Risk:  Thought constriction (tunnel vision)    Suicide Risk:  Minimal: No identifiable suicidal ideation.  Patients presenting with no risk factors but with morbid ruminations; may be classified as minimal risk based on the severity of the depressive symptoms   Follow-up Information     [place] Go on 03/06/2024.   Specialty: Professional Counselor Why: Please go to this provider on 03/06/24 at 9:00 am for an assessment, to receive therapy services.  You may also go Monday through Friday, from 9 am to 1 pm. Contact information: [address]  [address] [place] [address] [phone]         [facility]. Go on 03/07/2024.   Specialty: Behavioral Health Why: Please go to this provider on 03/07/24 at 7:00 am for an assessment, to receive medication management services. You may also go Monday through Friday, arrive by 7:00 am for an initial assessment. Contact information: [address] [place]  [address] [phone]                Plan Of Care/Follow-up recommendations:  See the discharge recommendation above.  Mac Bolster, NP, pmhnp, fbp-bc. 03/02/2024, 9:59 AM

## 2024-03-02 NOTE — Discharge Summary (Signed)
 Physician Discharge Summary Note  Patient:  Adrian Mcpherson is an 33 y.o., male  MRN:  [id number]  DOB:  [birth year]  Patient phone:  [phone] (home)   Patient address:   [address] [place] [address],   Total Time spent with patient: Greater than 30 minutes  Date of Admission:  02/28/2024  Date of Discharge: 03-02-74  Reason for Admission: Complaint of suicide attempt by overdose 8 tablets of Aleve/Tylenol . Apparently, this was triggered by a relationship break-up.  Principal Problem: MDD (major depressive disorder)  Discharge Diagnoses: Principal Problem:   MDD (major depressive disorder)  Past Psychiatric History: Major depressive disorder, single episodes.  Past Medical History: History reviewed. No pertinent past medical history. History reviewed. No pertinent surgical history.  Family History:  Family History  Problem Relation Age of Onset   Healthy Mother    Healthy Father    Family Psychiatric  History: See H&P.  Social History:  Social History   Substance and Sexual Activity  Alcohol Use Never     Social History   Substance and Sexual Activity  Drug Use Never    Social History   Socioeconomic History   Marital status: Single    Spouse name: Not on file   Number of children: Not on file   Years of education: Not on file   Highest education level: Not on file  Occupational History   Not on file  Tobacco Use   Smoking status: Never   Smokeless tobacco: Never  Vaping Use   Vaping status: Never Used  Substance and Sexual Activity   Alcohol use: Never   Drug use: Never   Sexual activity: Not on file  Other Topics Concern   Not on file  Social History Narrative   Not on file   Social Drivers of Health   Financial Resource Strain: Not on file  Food Insecurity: No Food Insecurity (02/28/2024)   Hunger Vital Sign    Worried About Running Out of Food in the Last Year: Never true    Ran Out of Food in the Last Year: Never true   Transportation Needs: No Transportation Needs (02/28/2024)   PRAPARE - Administrator, Civil Service (Medical): No    Lack of Transportation (Non-Medical): No  Physical Activity: Not on file  Stress: Not on file  Social Connections: Not on file   Hospital Course: (Per admission evaluation notes): 33 year old AA male with no known hx of mental health issues or treatments. However, he does smoke cannabis. Patient was taken to the [facility] hospital ED by the [place] police department with complaint of suicide attempt by overdose 8 tablets of Aleve/Tylenol . Apparently, this was triggered by a relationship break-up. After medical evaluation/stabilization at the [facility], patient was transferred to the [facility] for further psychiatric evaluation/treatments. Patient current lab results is reviewed. His UDS was positive for THC.   Upon the decision by his treatment team to discharge Adrian Mcpherson today, he was seen & evaluated  by his treatment team for mood stability. The current laboratory findings were reviewed, stable. The nurses notes & vital signs were reviewed as well. All are stable. At this present time, there are no current mental health or medical issues that should prevent this discharge at this time. Patient is being discharged to continue mental health care & medication management if he desires as noted below. He is also aware & agreeable to this discharge.  After his admission evaluation with the  knowledge that patient needed crisis intervention/medication management for his depressive symptoms due to suicide attempt. Adrian Mcpherson was informed/explained his tentative treatment plan for his consent to start medication management for his symptoms. However, he declined to be on any medications citing that he has not been on any psychotropic medication management in the past for any possible mental health issues.  He added that his behavior that landed him to the hospital under IVC  petition was rather impulsive. And with that said, he denied any symptoms of depression or anxiety & was asking to be discharged on the day of his admission. Adrian Mcpherson's request for discharge was declined as patient needed to be monitored to assure his mood stability/safety. He was however enrolled & participated in the group counseling sessions being offered & help on this unit. He learned coping skills.  During the course of this hospitalization, Adrian Mcpherson was visible on the unit during the day & in bed at night. He participated in the group counseling sessions being offered & held on this unit. He learned coping skills. There were no disruptive behaviors displayed throughout this hospitalization. He ate well & slept well without any complaints. And because he presented no behavioral issues during her hospital stay, she was not a candidate for a forced medications recommendations or order.   Adrian Mcpherson is seen daily for follow-up evaluation by his providers. He maintained that he was not feeling depressed or anxious. He maintained that he was not hearing voices or seeing things others were unable to hear or see. He maintained that he is not experiencing any delusional thinking.  He denied any suicidal or homicidal thoughts. He denied any thoughts of violence. He reported normal biological functions. The nursing staff reports that patient has been appropriate on the unit & no behavioral issues noted. Patient has not voiced any suicidal thoughts. Patient has not been adherent with treatment recommendations.    Patient was discussed at the team meeting yesterday. Team members felt that patient is probably at his baseline level of function. The team agreed with plan to discharge patient today to continue mental health care on an outpatient basis as noted below. Upon discharge, patient adamantly denies any SIHI, AVH, delusional thoughts or paranoia. He was able to engage in safety planning including plan to return to  [facility] or contact emergency services if he feels unable to maintain his own safety or the safety of others. Pt had no further questions, comments, or concerns. He left [facility] with all personal belongings in no apparent distress. Transportation per his family (mother). He was discharged on his blood pressure medicine. He was encouraged to continue to take & follow up with his primary care provider.  Physical Findings: AIMS:  , ,  ,  ,  ,  ,   CIWA:    COWS:     Musculoskeletal: Strength & Muscle Tone: within normal limits Gait & Station: normal Patient leans: N/A   Psychiatric Specialty Exam:  Presentation  General Appearance:  Appropriate for Environment; Casual; Fairly Groomed  Eye Contact: Good  Speech: Clear and Coherent; Normal Rate  Speech Volume: Normal  Handedness: Right   Mood and Affect  Mood: Euthymic  Affect: Appropriate; Congruent   Thought Process  Thought Processes: Coherent; Goal Directed  Descriptions of Associations:Intact  Orientation:Full (Time, Place and Person)  Thought Content:Logical  History of Schizophrenia/Schizoaffective disorder:No data recorded Duration of Psychotic Symptoms:No data recorded Hallucinations:Hallucinations: None  Ideas of Reference:None  Suicidal Thoughts:Suicidal Thoughts: No  Homicidal Thoughts:Homicidal Thoughts: No  Sensorium  Memory: Immediate Good; Recent Good; Remote Good  Judgment: Good  Insight: Good   Executive Functions  Concentration: Good  Attention Span: Good  Recall: Good  Fund of Knowledge: Good  Language: Good  Psychomotor Activity  Psychomotor Activity: Psychomotor Activity: Normal  Assets  Assets: Communication Skills; Desire for Improvement; Financial Resources/Insurance; Housing; Physical Health; Resilience; Social Support  Sleep  Sleep: Sleep: Good  Estimated Sleeping Duration (Last 24 Hours): 4.00-5.25 hours  Physical Exam: Physical Exam Vitals and  nursing note reviewed.  HENT:     Head: Normocephalic.     Nose: Nose normal.     Mouth/Throat:     Pharynx: Oropharynx is clear.  Eyes:     Pupils: Pupils are equal, round, and reactive to light.  Cardiovascular:     Pulses: Normal pulses.     Comments: Hx. HTN.  Patient is on amlodipine .  He is in no apparent distress. Pulmonary:     Effort: Pulmonary effort is normal.  Genitourinary:    Comments: Deferred. Musculoskeletal:        General: Normal range of motion.     Cervical back: Normal range of motion.  Skin:    General: Skin is dry.  Neurological:     General: No focal deficit present.     Mental Status: He is alert and oriented to person, place, and time. Mental status is at baseline.    Review of Systems  Constitutional:  Negative for chills, diaphoresis and fever.  HENT:  Negative for congestion and sore throat.   Respiratory:  Negative for cough, shortness of breath and wheezing.   Cardiovascular:  Negative for chest pain and palpitations.  Gastrointestinal:  Negative for abdominal pain, constipation, diarrhea, heartburn, nausea and vomiting.  Genitourinary:  Negative for dysuria.  Musculoskeletal:  Negative for joint pain and myalgias.  Skin:  Negative for itching and rash.  Neurological:  Negative for dizziness, tingling, tremors, sensory change, speech change, focal weakness, seizures, loss of consciousness, weakness and headaches.  Endo/Heme/Allergies:        Allergies: See allergy lists.  Psychiatric/Behavioral:  Positive for substance abuse (Hx. Cannabis use.). Negative for depression, hallucinations, memory loss and suicidal ideas. The patient is not nervous/anxious and does not have insomnia.    Blood pressure (!) 136/90, pulse (!) 52, temperature 98.2 F (36.8 C), temperature source Oral, resp. rate 17, height 6' (1.829 m), weight 101.2 kg, SpO2 100%. Body mass index is 30.24 kg/m.  Social History   Tobacco Use  Smoking Status Never  Smokeless  Tobacco Never   Tobacco Cessation:  N/A, patient does not currently use tobacco products  Blood Alcohol level:  Lab Results  Component Value Date   [facility] <15 02/27/2024   Metabolic Disorder Labs:  No results found for: HGBA1C, MPG No results found for: PROLACTIN No results found for: CHOL, TRIG, HDL, CHOLHDL, VLDL, LDLCALC  See Psychiatric Specialty Exam and Suicide Risk Assessment completed by Attending Physician prior to discharge.  Discharge destination:  Home  Is patient on multiple antipsychotic therapies at discharge:  No   Has Patient had three or more failed trials of antipsychotic monotherapy by history:  No  Recommended Plan for Multiple Antipsychotic Therapies: NA  Allergies as of 03/02/2024       Reactions   Mangifera Indica Fruit Ext (mango) [mangifera Indica] Hives, Swelling   Pineapple Hives, Swelling   Tomato Hives, Swelling        Medication List     TAKE these medications  Indication  amLODipine  5 MG tablet Commonly known as: NORVASC  Take 1 tablet (5 mg total) by mouth daily. For high blood pressure. Start taking on: March 03, 2024 What changed: additional instructions  Indication: High Blood Pressure        Follow-up Information     [place] Go on 03/06/2024.   Specialty: Professional Counselor Why: Please go to this provider on 03/06/24 at 9:00 am for an assessment, to receive therapy services.  You may also go Monday through Friday, from 9 am to 1 pm. Contact information: [address]  [address] [place] [address] [phone]         [facility]. Go on 03/07/2024.   Specialty: Behavioral Health Why: Please go to this provider on 03/07/24 at 7:00 am for an assessment, to receive medication management services. You may also go Monday through Friday, arrive by 7:00 am for an initial assessment. Contact information: [address] [place]   [address] [phone]               Follow-up recommendations:  Plan Of Care/Follow-up recommendations:  Activity: as tolerated Diet: heart healthy  Other: -Follow-up with your outpatient psychiatric provider -instructions on appointment date, time, and address (location) are provided to you in discharge paperwork.  -Take your psychiatric medications as prescribed at discharge - instructions are provided to you in the discharge paperwork  -Follow-up with outpatient primary care doctor and other specialists -for management of preventative medicine and chronic medical issues  -Testing: Follow-up with outpatient provider for abnormal lab results: NA  -If you are prescribed an atypical antipsychotic medication, we recommend that your outpatient psychiatrist follow routine screening for side effects within 3 months of discharge, including monitoring: AIMS scale, height, weight, blood pressure, fasting lipid panel, HbA1c, and fasting blood sugar.   -Recommend total abstinence from alcohol, tobacco, and other illicit drug use at discharge.   -If your psychiatric symptoms recur, worsen, or if you have side effects to your psychiatric medications, call your outpatient psychiatric provider, 911, [phone] or go to the nearest emergency department.  -If suicidal thoughts occur, immediately call your outpatient psychiatric provider, 911, [phone] or go to the nearest emergency department.  Signed: Mac Bolster, NP, pmhnp, fnp-bc. 03/02/2024, 10:15 AM

## 2024-03-02 NOTE — Group Note (Signed)
 Date:  03/02/2024 Time:  9:35 AM  Group Topic/Focus:  Goals Group:   The focus of this group is to help patients establish daily goals to achieve during treatment and discuss how the patient can incorporate goal setting into their daily lives to aide in recovery. Orientation:   The focus of this group is to educate the patient on the purpose and policies of crisis stabilization and provide a format to answer questions about their admission.  The group details unit policies and expectations of patients while admitted.    Participation Level:  Did Not Attend  Adrian Mcpherson 03/02/2024, 9:35 AM

## 2024-03-02 NOTE — BHH Suicide Risk Assessment (Signed)
 [facility] Discharge Suicide Risk Assessment   Principal Problem: MDD (major depressive disorder) Discharge Diagnoses: Principal Problem:   MDD (major depressive disorder)   Total Time spent with patient: 30 minutes  Musculoskeletal: Strength & Muscle Tone: within normal limits Gait & Station: normal Patient leans: N/A  Psychiatric Specialty Exam  Presentation  General Appearance and behavior:  Casually dressed, not in any distress, appropriate behavior, engaged politely.  No EPS.  Eye Contact: Good.  Speech: Spontaneous.  Normal rate, tone and volume.  Normal prosody of speech.  Mood and Affect  Mood: Euthymic.  Affect: Full range and appropriate.  Thought Process  Thought Processes: Linear and goal directed.  Descriptions of Associations:Intact  Orientation:Full (Time, Place and Person)  Thought Content: Future-oriented.  No current suicidal thoughts.  No homicidal thoughts.  No thoughts of violence.  No negative ruminative flooding.  No guilty ruminations.  No delusional theme.  No obsessions.  Hallucinations: No hallucination in any modality.  Sensorium  Memory: Good.  Judgment: Good.  Insight: Good  Executive Functions  Concentration: Good.  Attention Span: Good.  Recall: Good.  Fund of Knowledge: Good.  Language: Good   Psychomotor Activity  Normal psychomotor activity   Physical Exam: Physical Exam ROS Blood pressure (!) 136/90, pulse (!) 52, temperature 98.2 F (36.8 C), temperature source Oral, resp. rate 17, height 6' (1.829 m), weight 101.2 kg, SpO2 100%. Body mass index is 30.24 kg/m.  Mental Status Per Nursing Assessment::   On Admission:  Suicidal ideation indicated by patient  Demographic Factors:  Male  Loss Factors: Loss of significant relationship  Historical Factors: NA  Risk Reduction Factors:   Responsible for children under 55 years of age, Sense of responsibility to family, Employed, Living with another  person, especially a relative, Positive social support, Positive therapeutic relationship, and Positive coping skills or problem solving skills  Continued Clinical Symptoms:  Presented with adjustment disorder following recent break-up.  Patient has come to terms with recent break-up.  There are no symptoms of depression.  No overwhelming anxiety lately.  No evidence of mania.  No evidence of psychosis.  Cognitive Features That Contribute To Risk:  None    Suicide Risk:  Minimal: No current suicidal thoughts.  No homicidal thoughts.  No thoughts of violence.  Patient has been able to process recent break-up during this hospitalization.  Having children as a huge protective factor for him.  He has support from his family.  No new psychosocial stressors.  No substance use disorder. At this point in time, patient is not a danger to himself or to others.  He is stable for care at the lower setting.   Follow-up Information     [place] Go on 03/06/2024.   Specialty: Professional Counselor Why: Please go to this provider on 03/06/24 at 9:00 am for an assessment, to receive therapy services.  You may also go Monday through Friday, from 9 am to 1 pm. Contact information: [address]  [address] [place] [address] [phone]         [facility]. Go on 03/07/2024.   Specialty: Behavioral Health Why: Please go to this provider on 03/07/24 at 7:00 am for an assessment, to receive medication management services. You may also go Monday through Friday, arrive by 7:00 am for an initial assessment. Contact information: [address] [place]  [id number] [phone]                Plan Of  Care/Follow-up recommendations:  See discharge summary  Jerrell DELENA Forehand, MD 03/02/2024, 10:01 AM

## 2024-03-02 NOTE — Transportation (Signed)
 03/02/2024  Adrian Mcpherson DOB: 12/30/1990 MRN: [id number]   RIDER WAIVER AND RELEASE OF LIABILITY  For the purposes of helping with transportation needs, [facility] partners with outside transportation providers (taxi companies, [place], Catering manager.) to give [place] patients or other approved people the choice of on-demand rides Public librarian) to our buildings for non-emergency visits.  By using [employer], I, the person signing this document, on behalf of myself and/or any legal minors (in my care using the [employer]), agree:  Science writer given to me are supplied by independent, outside transportation providers who do not work for, or have any affiliation with, [employer]. [place] is not a transportation company. [place] has no control over the quality or safety of the rides I get using [employer]. [place] has no control over whether any outside ride will happen on time or not. [facility] gives no guarantee on the reliability, quality, safety, or availability on any rides, or that no mistakes will happen. I know and accept that traveling by vehicle (car, truck, SVU, fleeta, bus, taxi, etc.) has risks of serious injuries such as disability, being paralyzed, and death. I know and agree the risk of using [employer] is mine alone, and not [employer]. [employer] are provided as is and as are available. The transportation providers are in charge for all inspections and care of the vehicles used to provide these rides. I agree not to take legal action against [facility], its agents, employees, officers, directors, representatives, insurers, attorneys, assigns, successors, subsidiaries, and affiliates at any time for any reasons related directly or indirectly to using [employer]. I also agree not to take legal action against [facility] or its affiliates for any injury, death, or damage to property caused by or related to using  [employer]. I have read this Waiver and Release of Liability, and I understand the terms used in it and their legal meaning. This Waiver is freely and voluntarily given with the understanding that my right (or any legal minors) to legal action against  relating to [employer] is knowingly given up to use these services.   I attest that I read the Ride Waiver and Release of Liability to Adrian Mcpherson, gave Mr. Speaker the opportunity to ask questions and answered the questions asked (if any). I affirm that Adrian Mcpherson then provided consent for assistance with transportation.

## 2024-03-02 NOTE — Progress Notes (Signed)
Pt discharged to lobby. Pt was stable and appreciative at that time. All papers and electronic prescriptions were given and valuables returned. Suicide safety plan completed. Verbal understanding expressed. Denies SI/HI and A/VH. Pt given opportunity to express concerns and ask questions. 

## 2024-03-02 NOTE — Progress Notes (Addendum)
  [facility] Adult Case Management Discharge Plan :  Will you be returning to the same living situation after discharge:  Yes,  pt returning home at discharge At discharge, do you have transportation home?: Yes,   Pt's mother able to pick him up at 1100AM Do you have the ability to pay for your medications: Yes,  pt has active health insurance coverage  Release of information consent forms completed and in the chart;  Patient's signature needed at discharge.  Patient to Follow up at:  Follow-up Information     [place] Go on 03/06/2024.   Specialty: Professional Counselor Why: Please go to this provider on 03/06/24 at 9:00 am for an assessment, to receive therapy services.  You may also go Monday through Friday, from 9 am to 1 pm. Contact information: [address]  [address] [place] [address] [phone]         [facility]. Go on 03/07/2024.   Specialty: Behavioral Health Why: Please go to this provider on 03/07/24 at 7:00 am for an assessment, to receive medication management services. You may also go Monday through Friday, arrive by 7:00 am for an initial assessment. Contact information: [address] [place]  [id number] [phone]                Next level of care provider has access to [facility] Link:no  Safety Planning and Suicide Prevention discussed: Yes,  mother, Carolyn DeCosta [phone]    Has patient been referred to the Quitline?: Patient does not use tobacco/nicotine products  Patient has been referred for addiction treatment: No known substance use disorder.  Jenkins LULLA Primer, LCSWA 03/02/2024, 8:44 AM

## 2024-03-02 NOTE — Progress Notes (Signed)
   03/02/24 0900  Psych Admission Type (Psych Patients Only)  Admission Status Involuntary  Psychosocial Assessment  Patient Complaints None  Eye Contact Fair  Facial Expression Animated  Affect Appropriate to circumstance  Speech Logical/coherent  Interaction Assertive  Motor Activity Other (Comment) (level 3 observation)  Appearance/Hygiene In scrubs  Behavior Characteristics Cooperative;Appropriate to situation  Mood Pleasant  Thought Process  Coherency WDL  Content WDL  Delusions None reported or observed  Perception WDL  Hallucination None reported or observed  Judgment WDL  Danger to Self  Current suicidal ideation? Denies  Danger to Others  Danger to Others None reported or observed
